# Patient Record
Sex: Male | Born: 1940 | Race: White | Hispanic: No | Smoking: Former smoker
Health system: Southern US, Community
[De-identification: ages and names within clinical notes are randomized; demographics above are authoritative.]

## PROBLEM LIST (undated history)

## (undated) DIAGNOSIS — I471 Supraventricular tachycardia, unspecified: Secondary | ICD-10-CM

## (undated) DIAGNOSIS — K279 Peptic ulcer, site unspecified, unspecified as acute or chronic, without hemorrhage or perforation: Secondary | ICD-10-CM

## (undated) DIAGNOSIS — E119 Type 2 diabetes mellitus without complications: Secondary | ICD-10-CM

## (undated) DIAGNOSIS — I10 Essential (primary) hypertension: Secondary | ICD-10-CM

## (undated) DIAGNOSIS — I209 Angina pectoris, unspecified: Secondary | ICD-10-CM

## (undated) DIAGNOSIS — I2510 Atherosclerotic heart disease of native coronary artery without angina pectoris: Secondary | ICD-10-CM

## (undated) DIAGNOSIS — Z972 Presence of dental prosthetic device (complete) (partial): Secondary | ICD-10-CM

## (undated) DIAGNOSIS — E785 Hyperlipidemia, unspecified: Secondary | ICD-10-CM

## (undated) DIAGNOSIS — N200 Calculus of kidney: Secondary | ICD-10-CM

## (undated) DIAGNOSIS — F329 Major depressive disorder, single episode, unspecified: Secondary | ICD-10-CM

## (undated) DIAGNOSIS — Z87442 Personal history of urinary calculi: Secondary | ICD-10-CM

## (undated) DIAGNOSIS — I219 Acute myocardial infarction, unspecified: Secondary | ICD-10-CM

## (undated) DIAGNOSIS — F32A Depression, unspecified: Secondary | ICD-10-CM

## (undated) DIAGNOSIS — E669 Obesity, unspecified: Secondary | ICD-10-CM

## (undated) DIAGNOSIS — L409 Psoriasis, unspecified: Secondary | ICD-10-CM

## (undated) HISTORY — DX: Supraventricular tachycardia: I47.1

## (undated) HISTORY — DX: Hyperlipidemia, unspecified: E78.5

## (undated) HISTORY — DX: Atherosclerotic heart disease of native coronary artery without angina pectoris: I25.10

## (undated) HISTORY — DX: Supraventricular tachycardia, unspecified: I47.10

---

## 1996-02-21 DIAGNOSIS — I2510 Atherosclerotic heart disease of native coronary artery without angina pectoris: Secondary | ICD-10-CM

## 1996-02-21 HISTORY — DX: Atherosclerotic heart disease of native coronary artery without angina pectoris: I25.10

## 1996-02-21 HISTORY — PX: CORONARY ARTERY BYPASS GRAFT: SHX141

## 2004-01-25 ENCOUNTER — Ambulatory Visit: Payer: Self-pay | Admitting: Urology

## 2004-04-28 ENCOUNTER — Ambulatory Visit: Payer: Self-pay | Admitting: Gastroenterology

## 2004-05-06 ENCOUNTER — Ambulatory Visit: Payer: Self-pay | Admitting: Gastroenterology

## 2004-06-30 ENCOUNTER — Ambulatory Visit: Payer: Self-pay | Admitting: Gastroenterology

## 2004-10-27 ENCOUNTER — Ambulatory Visit: Payer: Self-pay | Admitting: Urology

## 2005-01-17 ENCOUNTER — Inpatient Hospital Stay: Payer: Self-pay | Admitting: Unknown Physician Specialty

## 2005-05-31 ENCOUNTER — Ambulatory Visit: Payer: Self-pay | Admitting: Internal Medicine

## 2005-12-07 ENCOUNTER — Emergency Department: Payer: Self-pay | Admitting: Emergency Medicine

## 2005-12-18 ENCOUNTER — Emergency Department: Payer: Self-pay | Admitting: Emergency Medicine

## 2012-04-25 ENCOUNTER — Inpatient Hospital Stay: Payer: Self-pay

## 2012-04-25 LAB — COMPREHENSIVE METABOLIC PANEL
Albumin: 3.7 g/dL (ref 3.4–5.0)
Anion Gap: 6 — ABNORMAL LOW (ref 7–16)
BUN: 21 mg/dL — ABNORMAL HIGH (ref 7–18)
Calcium, Total: 9.3 mg/dL (ref 8.5–10.1)
Chloride: 103 mmol/L (ref 98–107)
Creatinine: 1.37 mg/dL — ABNORMAL HIGH (ref 0.60–1.30)
EGFR (African American): 59 — ABNORMAL LOW
EGFR (Non-African Amer.): 51 — ABNORMAL LOW
Glucose: 226 mg/dL — ABNORMAL HIGH (ref 65–99)
Osmolality: 278 (ref 275–301)
SGOT(AST): 17 U/L (ref 15–37)
SGPT (ALT): 16 U/L (ref 12–78)
Sodium: 134 mmol/L — ABNORMAL LOW (ref 136–145)

## 2012-04-25 LAB — URINALYSIS, COMPLETE
Bacteria: NONE SEEN
Bilirubin,UR: NEGATIVE
Glucose,UR: NEGATIVE mg/dL (ref 0–75)
Ketone: NEGATIVE
Leukocyte Esterase: NEGATIVE
Nitrite: NEGATIVE
Protein: 30
RBC,UR: 2 /HPF (ref 0–5)
Specific Gravity: 1.021 (ref 1.003–1.030)
Squamous Epithelial: NONE SEEN
WBC UR: <1 /HPF (ref 0–5)

## 2012-04-25 LAB — RAPID INFLUENZA A&B ANTIGENS

## 2012-04-25 LAB — LIPID PANEL
Cholesterol: 158 mg/dL (ref 0–200)
HDL Cholesterol: 77 mg/dL — ABNORMAL HIGH (ref 40–60)
Ldl Cholesterol, Calc: 62 mg/dL (ref 0–100)
Triglycerides: 97 mg/dL (ref 0–200)

## 2012-04-25 LAB — CBC
HCT: 38 % — ABNORMAL LOW (ref 40.0–52.0)
MCV: 98 fL (ref 80–100)
RDW: 12.4 % (ref 11.5–14.5)
WBC: 10.6 10*3/uL (ref 3.8–10.6)

## 2012-04-25 LAB — TROPONIN I: Troponin-I: <0.02 ng/mL

## 2012-04-26 LAB — CBC WITH DIFFERENTIAL/PLATELET
Basophil #: 0 10*3/uL (ref 0.0–0.1)
Basophil %: 0.3 %
Eosinophil #: 0.1 10*3/uL (ref 0.0–0.7)
Eosinophil %: 0.4 %
HCT: 35.4 % — ABNORMAL LOW (ref 40.0–52.0)
HGB: 12 g/dL — ABNORMAL LOW (ref 13.0–18.0)
Lymphocyte #: 1.4 10*3/uL (ref 1.0–3.6)
Lymphocyte %: 11.8 %
MCH: 33.6 pg (ref 26.0–34.0)
MCHC: 33.9 g/dL (ref 32.0–36.0)
MCV: 99 fL (ref 80–100)
Monocyte #: 1.2 x10 3/mm — ABNORMAL HIGH (ref 0.2–1.0)
Neutrophil #: 9.3 10*3/uL — ABNORMAL HIGH (ref 1.4–6.5)
Neutrophil %: 77.5 %
Platelet: 153 10*3/uL (ref 150–440)
RBC: 3.57 10*6/uL — ABNORMAL LOW (ref 4.40–5.90)
RDW: 12.3 % (ref 11.5–14.5)

## 2012-04-26 LAB — BASIC METABOLIC PANEL
Anion Gap: 11 (ref 7–16)
Calcium, Total: 8.9 mg/dL (ref 8.5–10.1)
Chloride: 103 mmol/L (ref 98–107)
Co2: 21 mmol/L (ref 21–32)
Creatinine: 1.24 mg/dL (ref 0.60–1.30)

## 2012-04-27 LAB — CBC WITH DIFFERENTIAL/PLATELET
Basophil %: 0.5 %
Eosinophil #: 0.3 10*3/uL (ref 0.0–0.7)
Eosinophil %: 4.2 %
HCT: 35.3 % — ABNORMAL LOW (ref 40.0–52.0)
Lymphocyte #: 1.5 10*3/uL (ref 1.0–3.6)
Lymphocyte %: 18.2 %
MCHC: 34.8 g/dL (ref 32.0–36.0)
MCV: 98 fL (ref 80–100)
Monocyte #: 0.6 x10 3/mm (ref 0.2–1.0)
Platelet: 170 10*3/uL (ref 150–440)
RBC: 3.59 10*6/uL — ABNORMAL LOW (ref 4.40–5.90)
RDW: 12.1 % (ref 11.5–14.5)

## 2012-04-27 LAB — COMPREHENSIVE METABOLIC PANEL
Albumin: 3.1 g/dL — ABNORMAL LOW (ref 3.4–5.0)
Alkaline Phosphatase: 71 U/L (ref 50–136)
Anion Gap: 7 (ref 7–16)
Bilirubin,Total: 0.8 mg/dL (ref 0.2–1.0)
Calcium, Total: 9.2 mg/dL (ref 8.5–10.1)
EGFR (African American): 55 — ABNORMAL LOW
EGFR (Non-African Amer.): 48 — ABNORMAL LOW
Glucose: 131 mg/dL — ABNORMAL HIGH (ref 65–99)
Osmolality: 281 (ref 275–301)
Potassium: 4.2 mmol/L (ref 3.5–5.1)
SGOT(AST): 18 U/L (ref 15–37)
Sodium: 138 mmol/L (ref 136–145)
Total Protein: 7.6 g/dL (ref 6.4–8.2)

## 2012-04-28 LAB — URINE CULTURE

## 2012-04-28 LAB — LIPASE, BLOOD: Lipase: 165 U/L (ref 73–393)

## 2013-09-10 DIAGNOSIS — N289 Disorder of kidney and ureter, unspecified: Secondary | ICD-10-CM | POA: Insufficient documentation

## 2013-09-10 DIAGNOSIS — L409 Psoriasis, unspecified: Secondary | ICD-10-CM | POA: Insufficient documentation

## 2013-09-10 DIAGNOSIS — E669 Obesity, unspecified: Secondary | ICD-10-CM | POA: Insufficient documentation

## 2013-09-10 DIAGNOSIS — E785 Hyperlipidemia, unspecified: Secondary | ICD-10-CM | POA: Insufficient documentation

## 2014-03-17 DIAGNOSIS — N200 Calculus of kidney: Secondary | ICD-10-CM | POA: Insufficient documentation

## 2014-06-10 DIAGNOSIS — D649 Anemia, unspecified: Secondary | ICD-10-CM | POA: Insufficient documentation

## 2014-06-12 NOTE — Consult Note (Signed)
Pt seen and examined. Full consult to follow. Acute pancreatits. 1st episode. Feels better. Lipase back to normal. U/S and CT report reviewed. Agree with MRCP.If neg for any mass, ok to start clear liquid diet and advance as tolerated. If possible mass, then order CA 19-9 level and then EUS as outpt. Will follow. Thanks.  Electronic Signatures: Verdie Shire (MD)  (Signed on 07-Mar-14 11:06)  Authored  Last Updated: 07-Mar-14 11:06 by Verdie Shire (MD)

## 2014-06-12 NOTE — Discharge Summary (Signed)
PATIENT NAME:  Johnny [name], Johnny [name] MR#:  [id number] DATE OF BIRTH:  1941-02-15  DATE OF ADMISSION:  04/25/2012 DATE OF DISCHARGE: 04/28/2012  CONSULTANTS: Verdie Shire, MD, gastroenterology.   DISCHARGE DIAGNOSIS: Acute pancreatitis.   DISCHARGE MEDICATIONS:  1.  Glipizide 5 mg 2 tabs in the morning and 1 tab at supper.  2.  Metformin 1000 mg b.i.d.  3.  Zoloft 100 mg daily.  4.  Simvastatin 80 mg at bedtime.  5.  Aspirin 325 mg daily.  6.  Isosorbide dinitrate 20 mg t.i.d.  7.  Losartan 100 mg daily.  8.  Prilosec 20 mg once daily.  9.  Allopurinol 100 mg once daily.   HISTORY AND PHYSICAL: A 74 year old male with a history of diabetes, hypertension, and coronary disease, status post CABG presented to the ED with complaints of abdominal pain. The pain had been ongoing for 4 days. The pain was accompanied by nausea, vomiting, fevers and chills. CT of the abdomen in the ER demonstrated pancreatic inflammation. Labs demonstrated an elevated lipase level of 1228. Liver tests were normal. WBC was mildly elevated at 10.6. Ultrasound of the upper quadrant was fairly unremarkable, no gallstones were demonstrated.   HOSPITAL COURSE: The patient was admitted. He was kept n.p.o. He was initiated on IV fluids. He was given pain control. Gastroenterology was consulted. He was evaluated by Dr. Candace Cruise. M.R.C.P. was also obtained, which showed no focal pancreatic mass. Again, no gallstones were seen. No cysts or pseudocysts were seen in the pancreas. On his third hospital day, he was initiated on clear liquid diet. He tolerated his clear liquids. His pain seemed to improve. His lipase level also improved. On his third hospital day, his diet was advanced to solid, low fat diet, which he tolerated well. Lipase level normalized and he was felt ready for discharge.   DISCHARGE INSTRUCTIONS:  1.  Follow up with Dr. Gilford Rile in 1 to 2 weeks.  2.  Continue to eat a low-fat diet.  3.  Resume all home medications.     ____________________________ A. Lavone Orn, MD ams:aw D: 04/28/2012 09:50:00 ET T: 04/28/2012 11:51:53 ET JOB#: [id number]  cc: Jenny Reichmann B. Sarina Ser, MD A. Lavone Orn, MD, <Dictator>   Sherlon Handing MD ELECTRONICALLY SIGNED 05/07/2012 13:11

## 2014-06-12 NOTE — H&P (Signed)
PATIENT NAME:  Johnny, [name] MR#:  [id number] DATE OF BIRTH:  27-Nov-1940  DATE OF ADMISSION:  04/25/2012  ADMITTING PHYSICIAN: Johnny Lighter, MD  PRIMARY CARE PHYSICIAN: Johnny B. Gilford Rile III, MD   CHIEF COMPLAINT: Abdominal pain.   HISTORY OF PRESENT ILLNESS: The patient is a 74 year old pleasant Caucasian male with past medical history significant for diabetes, hypertension and coronary artery disease, status post bypass graft history, comes to the hospital from home secondary to worsening abdominal pain, nausea and vomiting for 4 days now. The patient said it all started with chills and fever and headache 4 days ago associated with periumbilical abdominal pain with several episodes of vomiting 4 days ago. He initially thought that the pain and symptoms would get better, so he waited for a couple of days, as the symptoms started to get worse,  presented to the ER. In the ER, his main complaint was abdominal pain and vomiting. So, he had a CT of his abdomen done which showed pancreatic inflammation and his lipase was elevated, so he is being admitted for acute pancreatitis.   PAST MEDICAL HISTORY: 1.  Type II diabetes mellitus, non-insulin-dependent.  2.  History of nephrolithiasis.   3.  Hyperlipidemia.  4.  Coronary artery disease, status post bypass graft surgery in 1998.  5.  Peptic ulcer disease.  6.  Chronic anemia.   PAST SURGICAL HISTORY: 1.  Cervical laminectomy.  2.  Lumbar laminectomy.  3.  Coronary artery bypass graft surgery in 1998.   ALLERGIES TO MEDICATIONS: ACE INHIBITORS.   CURRENT HOME MEDICATIONS:  1.  Allopurinol 100 mg p.o. daily.  2.  Aspirin 325 mg p.o. daily.  3.  Isosorbide dinitrate 20 mg p.o. 3 times a day.  4.  Losartan 100 mg p.o. daily.  5.  Metformin 1000 mg p.o. b.i.d.  6.  Prilosec 20 mg p.o. daily.  7.  Simvastatin 80 mg p.o. daily.  8.  Zoloft 100 mg at bedtime.  9.  Glipizide 10 mg in the morning and 5 mg at bedtime.   SOCIAL HISTORY:  Lives at home with his wife. No history of any smoking or alcohol use.   FAMILY HISTORY:  Heart disease in both parents,  no history of diabetes in the family.    REVIEW OF SYSTEMS:   CONSTITUTIONAL: Positive for fever, fatigue and weakness.  EYES: No blurred vision, double vision, glaucoma or cataracts. Uses reading glasses.  ENT: No tinnitus, ear pain, hearing loss, epistaxis or discharge.  RESPIRATORY: Positive for dry cough. No wheeze, hemoptysis or COPD. CARDIOVASCULAR: No chest pain, orthopnea, edema, arrhythmia, palpitations or syncope.  GASTROINTESTINAL: Positive for nausea, vomiting and abdominal pain. No hematemesis, melena or diarrhea.  GENITOURINARY: No dysuria, hematuria, renal calculus, frequency or incontinence.  ENDOCRINE: No polyuria, nocturia, thyroid problems, heat or cold intolerance.  HEMATOLOGY: No anemia, easy bruising or bleeding.  SKIN: No acne, rash or lesions.  MUSCULOSKELETAL: No neck, back, shoulder pain, arthritis or gout.  NEUROLOGIC: No numbness, weakness, CVA, TIA or seizures.  PSYCHOLOGICAL: No anxiety, insomnia or depression.   PHYSICAL EXAMINATION: VITAL SIGNS: Temperature 98.7 degrees Fahrenheit, pulse 78, respirations 18, blood pressure 116/73, pulse oximetry 97% on room air.  GENERAL: Well-built, well-nourished male lying in bed, not in any acute distress.  HEENT: Normocephalic, atraumatic. Pupils round, reacting to light. Anicteric sclerae. Extraocular movements intact. Oropharynx clear without erythema, mass or exudates.  NECK: Supple. No thyromegaly, JVD or carotid bruits. No lymphadenopathy.  LUNGS: Moving air bilaterally. No wheeze or  crackles. No use of accessory muscles for breathing.   CARDIOVASCULAR: S1, S2 regular rate and rhythm, 3/6 systolic murmur in mitral and aortic areas.  No rubs or gallops.  ABDOMEN: Soft and tender with voluntary guarding in the right upper quadrant and periumbilical regions, radiating to the back. No rigidity or  rebound tenderness.  Hypoactive bowel sounds present.    EXTREMITIES: No pedal edema. No clubbing or cyanosis, 2+ dorsalis pedis pulses palpable bilaterally.  SKIN: No acne, rash or lesions.  LYMPHATICS: No cervical lymphadenopathy.  NEUROLOGIC: Cranial nerves intact. No focal motor or sensory deficits.  PSYCHOLOGICAL: The patient is awake, alert, oriented x 3.   LABORATORY, DIAGNOSTIC AND RADIOLOGIC DATA: WBC 10.6, hemoglobin 13.1, hematocrit 38.0, platelet count 182.   Sodium 134, potassium 4.2, chloride 103, bicarbonate 25, BUN 21, creatinine 1.37 and glucose of 226, calcium 9.3. ALT 16, AST 17, alkaline phosphatase 79, total bilirubin is 0.9 and albumin 3.7. Cardiac enzymes first set negative. Urinalysis negative for any infection and lipase elevated at 1228.   Ultrasound of the abdomen showing unremarkable right upper quadrant ultrasound. No dilatation of intra or extrahepatic bile ducts and no evidence of cholelithiasis.    CT of the abdomen and pelvis with contrast showing low attenuation in the pancreatic head with surrounding hazy inflammatory changes concerning for pancreatitis. Underlying lesion is not excluded.  Follow up of the CT of the abdomen is recommended and correlate with lipase levels.    EKG showing normal sinus rhythm, heart rate of 63. No acute ST-T wave abnormalities.   ASSESSMENT AND PLAN: A 74 year old male with history of diabetes, hypertension and coronary artery disease admitted for abdominal pain and CT of the abdomen showing pancreatitis with elevated lipase.  1.  Acute pancreatitis, likely gallstone pancreatitis. No gallstones seen on CT, but his presentation is typical of that. However, he is on some medications, which he has been taking for a long time, could also worsen his pancreatitis. We will keep him nothing orally, hold all oral medications, IV fluids and follow-up lipase in the morning. Also, his CT did suggest a possible underlying pancreatic mass, which  cannot be excluded, so we will get an MRCP. 2.  Hypertension. Hold oral medications, as the patient is nothing orally. We will place on IV hydralazine as needed.   3.  Diabetes mellitus.  Again follow up hba1c and hold his oral medication and on sliding scale insulin as he is not eating orally.    3. Coronary artery disease, status post bypass graft surgery, appears stable for now. Continue to monitor and hold oral medications until stable from pancreatitis standpoint.  4.  Acute renal failure from dehydration, prerenal causes and he is also on losartan and metformin at home, which we are holding at this time, gentle IV hydration and follow up labs in the morning.   5.  Gastrointestinal and deep vein thrombosis prophylaxis. On Protonix IV and subcutaneous heparin.   CODE STATUS: Full code.   TIME SPENT ON ADMISSION: 50 minutes.      ____________________________ Johnny Lighter, MD rk:cc D: 04/25/2012 16:04:49 ET T: 04/25/2012 17:11:23 ET JOB#: [id number]  cc: Johnny Lighter, MD, <Dictator> Johnny B. Sarina Ser, MD Johnny Lighter MD ELECTRONICALLY SIGNED 05/04/2012 13:18

## 2014-06-12 NOTE — Consult Note (Signed)
Chief Complaint:  Subjective/Chief Complaint Feeling hungry. Minimal abd pain. MRI showed no pancreatic mass. Started clears this AM.   VITAL SIGNS/ANCILLARY NOTES: **Vital Signs.:   08-Mar-14 05:34  Vital Signs Type Routine  Temperature Temperature (F) 98.3  Celsius 36.8  Temperature Source oral  Pulse Pulse 58  Respirations Respirations 18  Systolic BP Systolic BP 563  Diastolic BP (mmHg) Diastolic BP (mmHg) 60  Mean BP 74  Pulse Ox % Pulse Ox % 93  Pulse Ox Activity Level  At rest  Oxygen Delivery Room Air/ 21 %   Brief Assessment:  Cardiac Regular   Respiratory clear BS   Gastrointestinal Normal   Lab Results: Hepatic:  08-Mar-14 04:47   Bilirubin, Total 0.8  Alkaline Phosphatase 71  SGPT (ALT) 13  SGOT (AST) 18  Total Protein, Serum 7.6  Albumin, Serum  3.1  Routine Chem:  08-Mar-14 04:47   Glucose, Serum  131  BUN  22  Creatinine (comp)  1.44  Sodium, Serum 138  Potassium, Serum 4.2  Chloride, Serum 107  CO2, Serum 24  Calcium (Total), Serum 9.2  Osmolality (calc) 281  eGFR (African American)  55  eGFR (Non-African American)  48 (eGFR values <45m/min/1.73 m2 may be an indication of chronic kidney disease (CKD). Calculated eGFR is useful in patients with stable renal function. The eGFR calculation will not be reliable in acutely ill patients when serum creatinine is changing rapidly. It is not useful in  patients on dialysis. The eGFR calculation may not be applicable to patients at the low and high extremes of body sizes, pregnant women, and vegetarians.)  Anion Gap 7  Routine Hem:  08-Mar-14 04:47   WBC (CBC) 8.0  RBC (CBC)  3.59  Hemoglobin (CBC)  12.3  Hematocrit (CBC)  35.3  Platelet Count (CBC) 170  MCV 98  MCH  34.2  MCHC 34.8  RDW 12.1  Neutrophil % 69.7  Lymphocyte % 18.2  Monocyte % 7.4  Eosinophil % 4.2  Basophil % 0.5  Neutrophil # 5.6  Lymphocyte # 1.5  Monocyte # 0.6  Eosinophil # 0.3  Basophil # 0.0 (Result(s) reported  on 27 Apr 2012 at 05:37AM.)   Assessment/Plan:  Assessment/Plan:  Assessment Acute pancreatitis. Resolving.   Plan Recheck lipase in AM. If stable, low fat diet. If tolerates low fat diet, then stable for discharge tomorrow. Thanks   Electronic Signatures: OVerdie Shire(MD)  (Signed 08-Mar-14 10:31)  Authored: Chief Complaint, VITAL SIGNS/ANCILLARY NOTES, Brief Assessment, Lab Results, Assessment/Plan   Last Updated: 08-Mar-14 10:31 by OVerdie Shire(MD)

## 2014-06-12 NOTE — Consult Note (Signed)
PATIENT NAME:  Johnny [name], Johnny [name] MR#:  [id number] DATE OF BIRTH:  23-Jun-1940  DATE OF ADMISSION:  04/25/2012  DATE OF CONSULTATION:  04/26/2012  CONSULTING PHYSICIAN: Verdie Shire, M.D.   REASON FOR REFERRAL:  Acute pancreatitis.   HISTORY OF PRESENT ILLNESS:  The patient is a 74 year old white male with a known history of coronary artery disease and diabetes, presented to the hospital on March 6 with at least a 4-day history of worsening abdominal pain, nausea and vomiting. It started with some periumbilical pain that radiated to his back associated with some fevers and chills and headaches. Because the symptoms were not getting better, he came in for further evaluation. He then had a CT of the abdomen that showed inflammation of the head of the pancreas. Also lipase was abnormal at 1228. As a result, the patient was brought in for further evaluation and management.   The patient feeling much better today, much of the pain has improved significantly, although he still has no appetite. His lipase is back to normal at 316.  The patient denies any prior history of gallbladder disease. He does not drink any alcohol or smoke. There is no family history of pancreatic disorders or gallbladder disorders that he is aware of.   PAST MEDICAL HISTORY:  Notable for diabetes and kidney stones. He also has had coronary artery disease that required bypass surgery in 1998.   Other history includes history of hyperlipidemia and ulcer disease.   PAST SURGICAL HISTORY:  Includes cervical lumbar laminectomy and bypass surgery in 1998.   ALLERGIES:  ACE INHIBITORS.   MEDICATIONS AT HOME:  Include regular aspirin daily, allopurinol daily, isosorbide, losartan, metformin, Prilosec, glipizide, Zoloft and Zocor.  Again, he denies any tobacco and alcohol history.  Family history was described already.  There is heart disease in both parents.   REVIEW OF SYMPTOMS:  Please refer to the initial H and P dictated yesterday on  March 6. There are no significant changes in review of symptoms.   PHYSICAL EXAMINATION: GENERAL:  The patient looks stable. He is afebrile. VITAL SIGNS:  Stable.  CARDIAC:  Reveal regular rhythm and rate without murmurs.  LUNGS:  Clear bilaterally.  ABDOMEN:  Shows normoactive bowel sounds, soft. There is only minimal tenderness in the epigastric region. There is no hepatomegaly.  EXTREMITIES:  Show no clubbing, cyanosis or edema.   LABORATORY DATA:  Again, the liver enzymes are normal. Lipase is back to normal at 316. Sodium 135, potassium 4.2, chloride 103, CO2 21, BUN 17, creatinine 1.24, glucose 158, white count 12.0, hemoglobin 12.0, platelet count 153. Urinalysis is negative.   In terms of the CT, there is some low attenuation in the pancreatic head with some surrounding inflammatory changes concerning for pancreatitis.  The patient with acute pancreatitis. It appears to be his first bout. At least the gallbladder looks normal and the liver enzymes look normal. I agree with getting an MRI of the pancreas to at least rule out pancreatic mass. If he shows a mass, then we should order a CA19-9 level as well as endoscopic ultrasound for confirmation and staging. If the MRCP is negative, then we can start the clear liquid diet and advance as tolerated.  Thank you for the referral.     ____________________________ Lupita Dawn. Candace Cruise, MD pyo:ce D: 04/27/2012 08:30:03 ET T: 04/27/2012 11:55:57 ET JOB#: [id number]  cc: Lupita Dawn. Candace Cruise, MD, <Dictator> Lupita Dawn OH MD ELECTRONICALLY SIGNED 04/28/2012 8:11

## 2014-06-12 NOTE — Consult Note (Signed)
Chief Complaint:  Subjective/Chief Complaint Back pain last night. No abd pain. Tolerated clears. Lipase remains normal.   VITAL SIGNS/ANCILLARY NOTES: **Vital Signs.:   09-Mar-14 04:59  Vital Signs Type Routine  Temperature Temperature (F) 98.6  Celsius 37  Temperature Source oral  Pulse Pulse 55  Respirations Respirations 18  Systolic BP Systolic BP 643  Diastolic BP (mmHg) Diastolic BP (mmHg) 73  Mean BP 94  Pulse Ox % Pulse Ox % 99  Pulse Ox Activity Level  At rest  Oxygen Delivery Room Air/ 21 %   Brief Assessment:  Cardiac Regular   Respiratory clear BS   Gastrointestinal Normal   Lab Results: Routine Chem:  09-Mar-14 04:29   Lipase 165 (Result(s) reported on 28 Apr 2012 at 05:36AM.)   Assessment/Plan:  Assessment/Plan:  Assessment Pancreatitis. Resolved.   Plan Low fat diet. If tolerated, ok for discharge. Will sign off. thanks.   Electronic Signatures: Verdie Shire (MD)  (Signed 09-Mar-14 09:21)  Authored: Chief Complaint, VITAL SIGNS/ANCILLARY NOTES, Brief Assessment, Lab Results, Assessment/Plan   Last Updated: 09-Mar-14 09:21 by Verdie Shire (MD)

## 2015-03-11 DIAGNOSIS — E1121 Type 2 diabetes mellitus with diabetic nephropathy: Secondary | ICD-10-CM | POA: Insufficient documentation

## 2015-03-11 DIAGNOSIS — E118 Type 2 diabetes mellitus with unspecified complications: Secondary | ICD-10-CM | POA: Insufficient documentation

## 2015-10-18 ENCOUNTER — Encounter: Admission: RE | Payer: Self-pay | Source: Ambulatory Visit

## 2015-10-18 ENCOUNTER — Ambulatory Visit: Admission: RE | Admit: 2015-10-18 | Payer: Medicare Other | Source: Ambulatory Visit | Admitting: Gastroenterology

## 2015-10-18 SURGERY — ESOPHAGOGASTRODUODENOSCOPY (EGD) WITH PROPOFOL
Anesthesia: General

## 2015-11-22 DIAGNOSIS — I2510 Atherosclerotic heart disease of native coronary artery without angina pectoris: Secondary | ICD-10-CM | POA: Insufficient documentation

## 2015-11-23 DIAGNOSIS — I200 Unstable angina: Secondary | ICD-10-CM | POA: Diagnosis present

## 2015-12-07 ENCOUNTER — Ambulatory Visit
Admission: RE | Admit: 2015-12-07 | Discharge: 2015-12-07 | Disposition: A | Discharge status: Home / Self Care | Payer: Medicare Other | Source: Ambulatory Visit | Attending: Internal Medicine | Admitting: Internal Medicine

## 2015-12-07 ENCOUNTER — Encounter
Admission: RE | Disposition: A | Discharge status: Home / Self Care | Payer: Self-pay | Source: Ambulatory Visit | Attending: Internal Medicine

## 2015-12-07 ENCOUNTER — Encounter: Payer: Self-pay | Admitting: *Deleted

## 2015-12-07 DIAGNOSIS — E669 Obesity, unspecified: Secondary | ICD-10-CM | POA: Diagnosis not present

## 2015-12-07 DIAGNOSIS — Z79899 Other long term (current) drug therapy: Secondary | ICD-10-CM | POA: Diagnosis not present

## 2015-12-07 DIAGNOSIS — Z87891 Personal history of nicotine dependence: Secondary | ICD-10-CM | POA: Insufficient documentation

## 2015-12-07 DIAGNOSIS — E7800 Pure hypercholesterolemia, unspecified: Secondary | ICD-10-CM | POA: Diagnosis not present

## 2015-12-07 DIAGNOSIS — I200 Unstable angina: Secondary | ICD-10-CM | POA: Diagnosis present

## 2015-12-07 DIAGNOSIS — E119 Type 2 diabetes mellitus without complications: Secondary | ICD-10-CM | POA: Insufficient documentation

## 2015-12-07 DIAGNOSIS — I25710 Atherosclerosis of autologous vein coronary artery bypass graft(s) with unstable angina pectoris: Secondary | ICD-10-CM | POA: Insufficient documentation

## 2015-12-07 DIAGNOSIS — Z951 Presence of aortocoronary bypass graft: Secondary | ICD-10-CM | POA: Diagnosis not present

## 2015-12-07 DIAGNOSIS — Z7984 Long term (current) use of oral hypoglycemic drugs: Secondary | ICD-10-CM | POA: Insufficient documentation

## 2015-12-07 DIAGNOSIS — Z6827 Body mass index (BMI) 27.0-27.9, adult: Secondary | ICD-10-CM | POA: Insufficient documentation

## 2015-12-07 DIAGNOSIS — I2582 Chronic total occlusion of coronary artery: Secondary | ICD-10-CM | POA: Diagnosis not present

## 2015-12-07 DIAGNOSIS — I10 Essential (primary) hypertension: Secondary | ICD-10-CM | POA: Diagnosis not present

## 2015-12-07 DIAGNOSIS — I25110 Atherosclerotic heart disease of native coronary artery with unstable angina pectoris: Secondary | ICD-10-CM | POA: Insufficient documentation

## 2015-12-07 DIAGNOSIS — I209 Angina pectoris, unspecified: Secondary | ICD-10-CM | POA: Diagnosis present

## 2015-12-07 HISTORY — DX: Angina pectoris, unspecified: I20.9

## 2015-12-07 HISTORY — DX: Type 2 diabetes mellitus without complications: E11.9

## 2015-12-07 HISTORY — DX: Essential (primary) hypertension: I10

## 2015-12-07 HISTORY — DX: Depression, unspecified: F32.A

## 2015-12-07 HISTORY — DX: Major depressive disorder, single episode, unspecified: F32.9

## 2015-12-07 HISTORY — PX: CARDIAC CATHETERIZATION: SHX172

## 2015-12-07 HISTORY — DX: Acute myocardial infarction, unspecified: I21.9

## 2015-12-07 SURGERY — LEFT HEART CATH AND CORS/GRAFTS ANGIOGRAPHY
Anesthesia: Moderate Sedation | Laterality: Left

## 2015-12-07 MED ORDER — SODIUM CHLORIDE 0.9% FLUSH: 3.0000 mL | INTRAVENOUS | Status: DC | PRN

## 2015-12-07 MED ORDER — HEPARIN (PORCINE) IN NACL 2-0.9 UNIT/ML-% IJ SOLN
INTRAMUSCULAR | Status: AC
  Filled 2015-12-07: qty 500

## 2015-12-07 MED ORDER — FENTANYL CITRATE (PF) 100 MCG/2ML IJ SOLN
INTRAMUSCULAR | Status: DC | PRN
  Administered 2015-12-07: 25 ug via INTRAVENOUS

## 2015-12-07 MED ORDER — IOPAMIDOL (ISOVUE-300) INJECTION 61%
INTRAVENOUS | Status: DC | PRN
  Administered 2015-12-07: 210 mL via INTRA_ARTERIAL

## 2015-12-07 MED ORDER — FENTANYL CITRATE (PF) 100 MCG/2ML IJ SOLN
INTRAMUSCULAR | Status: AC
  Filled 2015-12-07: qty 2

## 2015-12-07 MED ORDER — ASPIRIN 81 MG PO CHEW: 81.0000 mg | CHEWABLE_TABLET | ORAL | Status: DC

## 2015-12-07 MED ORDER — ACETAMINOPHEN 325 MG PO TABS: 650.0000 mg | ORAL_TABLET | ORAL | Status: DC | PRN

## 2015-12-07 MED ORDER — MIDAZOLAM HCL 2 MG/2ML IJ SOLN
INTRAMUSCULAR | Status: AC
  Filled 2015-12-07: qty 2

## 2015-12-07 MED ORDER — SODIUM CHLORIDE 0.9% FLUSH: 3.0000 mL | Freq: Two times a day (BID) | INTRAVENOUS | Status: DC

## 2015-12-07 MED ORDER — SODIUM CHLORIDE 0.9 % WEIGHT BASED INFUSION
3.0000 mL/kg/h | INTRAVENOUS | Status: DC
  Administered 2015-12-07: 3 mL/kg/h via INTRAVENOUS

## 2015-12-07 MED ORDER — SODIUM CHLORIDE 0.9 % WEIGHT BASED INFUSION: 3.0000 mL/kg/h | INTRAVENOUS | Status: DC

## 2015-12-07 MED ORDER — MIDAZOLAM HCL 2 MG/2ML IJ SOLN
INTRAMUSCULAR | Status: DC | PRN
  Administered 2015-12-07: 1 mg via INTRAVENOUS

## 2015-12-07 MED ORDER — SODIUM CHLORIDE 0.9 % IV SOLN: 250.0000 mL | INTRAVENOUS | Status: DC | PRN

## 2015-12-07 MED ORDER — ONDANSETRON HCL 4 MG/2ML IJ SOLN: 4.0000 mg | Freq: Four times a day (QID) | INTRAMUSCULAR | Status: DC | PRN

## 2015-12-07 MED ORDER — SODIUM CHLORIDE 0.9 % WEIGHT BASED INFUSION: 1.0000 mL/kg/h | INTRAVENOUS | Status: DC

## 2015-12-07 SURGICAL SUPPLY — 13 items
CATH 5FR IM DIAGNOSTIC (CATHETERS) ×1 IMPLANT
CATH 5FR JL4 DIAGNOSTIC (CATHETERS) ×2 IMPLANT
CATH 5FR PIGTAIL DIAGNOSTIC (CATHETERS) ×1 IMPLANT
CATH INFINITI 5 FR MPA2 (CATHETERS) ×2 IMPLANT
CATH INFINITI 5 FR RCB (CATHETERS) ×2 IMPLANT
CATH INFINITI JR4 5F (CATHETERS) ×2 IMPLANT
DEVICE CLOSURE MYNXGRIP 5F (Vascular Products) ×2 IMPLANT
KIT MANI 3VAL PERCEP (MISCELLANEOUS) ×3 IMPLANT
NDL PERC 18GX7CM (NEEDLE) IMPLANT
NEEDLE PERC 18GX7CM (NEEDLE) ×3 IMPLANT
PACK CARDIAC CATH (CUSTOM PROCEDURE TRAY) ×3 IMPLANT
SHEATH PINNACLE 5F 10CM (SHEATH) ×2 IMPLANT
WIRE EMERALD 3MM-J .035X150CM (WIRE) ×2 IMPLANT

## 2015-12-07 NOTE — Discharge Instructions (Signed)
°  For pain at the site of your procedure, take non-aspirin medicines such as Tylenol.  Medications: A. Hold Metformin for 48 hours if applicable.  B. Continue taking all your present medications at home unless your doctor prescribes any changes.

## 2015-12-21 DIAGNOSIS — I10 Essential (primary) hypertension: Secondary | ICD-10-CM | POA: Insufficient documentation

## 2015-12-21 DIAGNOSIS — I2119 ST elevation (STEMI) myocardial infarction involving other coronary artery of inferior wall: Secondary | ICD-10-CM | POA: Insufficient documentation

## 2017-01-18 DIAGNOSIS — I951 Orthostatic hypotension: Secondary | ICD-10-CM | POA: Insufficient documentation

## 2017-08-09 DIAGNOSIS — R0602 Shortness of breath: Secondary | ICD-10-CM | POA: Insufficient documentation

## 2017-08-09 DIAGNOSIS — R42 Dizziness and giddiness: Secondary | ICD-10-CM | POA: Insufficient documentation

## 2017-09-03 ENCOUNTER — Observation Stay
Admission: EM | Admit: 2017-09-03 | Discharge: 2017-09-04 | Disposition: A | Discharge status: Home / Self Care | Payer: Medicare Other | Attending: Specialist | Admitting: Specialist

## 2017-09-03 ENCOUNTER — Encounter: Payer: Self-pay | Admitting: Emergency Medicine

## 2017-09-03 ENCOUNTER — Emergency Department: Payer: Medicare Other

## 2017-09-03 ENCOUNTER — Observation Stay
Admit: 2017-09-03 | Discharge: 2017-09-03 | Disposition: A | Discharge status: Home / Self Care | Payer: Medicare Other | Attending: Specialist | Admitting: Specialist

## 2017-09-03 ENCOUNTER — Other Ambulatory Visit: Payer: Self-pay

## 2017-09-03 DIAGNOSIS — I2510 Atherosclerotic heart disease of native coronary artery without angina pectoris: Secondary | ICD-10-CM | POA: Insufficient documentation

## 2017-09-03 DIAGNOSIS — Z794 Long term (current) use of insulin: Secondary | ICD-10-CM | POA: Insufficient documentation

## 2017-09-03 DIAGNOSIS — G2581 Restless legs syndrome: Secondary | ICD-10-CM | POA: Insufficient documentation

## 2017-09-03 DIAGNOSIS — Z79899 Other long term (current) drug therapy: Secondary | ICD-10-CM | POA: Insufficient documentation

## 2017-09-03 DIAGNOSIS — I252 Old myocardial infarction: Secondary | ICD-10-CM | POA: Insufficient documentation

## 2017-09-03 DIAGNOSIS — R0789 Other chest pain: Secondary | ICD-10-CM | POA: Diagnosis present

## 2017-09-03 DIAGNOSIS — I10 Essential (primary) hypertension: Secondary | ICD-10-CM | POA: Insufficient documentation

## 2017-09-03 DIAGNOSIS — E782 Mixed hyperlipidemia: Secondary | ICD-10-CM | POA: Diagnosis not present

## 2017-09-03 DIAGNOSIS — I471 Supraventricular tachycardia: Principal | ICD-10-CM | POA: Insufficient documentation

## 2017-09-03 DIAGNOSIS — R001 Bradycardia, unspecified: Secondary | ICD-10-CM | POA: Diagnosis not present

## 2017-09-03 DIAGNOSIS — R079 Chest pain, unspecified: Secondary | ICD-10-CM | POA: Diagnosis present

## 2017-09-03 DIAGNOSIS — Z7984 Long term (current) use of oral hypoglycemic drugs: Secondary | ICD-10-CM | POA: Insufficient documentation

## 2017-09-03 DIAGNOSIS — F329 Major depressive disorder, single episode, unspecified: Secondary | ICD-10-CM | POA: Diagnosis not present

## 2017-09-03 DIAGNOSIS — Z951 Presence of aortocoronary bypass graft: Secondary | ICD-10-CM | POA: Insufficient documentation

## 2017-09-03 DIAGNOSIS — R52 Pain, unspecified: Secondary | ICD-10-CM

## 2017-09-03 DIAGNOSIS — Z888 Allergy status to other drugs, medicaments and biological substances status: Secondary | ICD-10-CM | POA: Insufficient documentation

## 2017-09-03 DIAGNOSIS — E119 Type 2 diabetes mellitus without complications: Secondary | ICD-10-CM | POA: Insufficient documentation

## 2017-09-03 DIAGNOSIS — Z7982 Long term (current) use of aspirin: Secondary | ICD-10-CM | POA: Diagnosis not present

## 2017-09-03 LAB — GLUCOSE, CAPILLARY
Glucose-Capillary: 223 mg/dL — ABNORMAL HIGH (ref 70–99)
Glucose-Capillary: 211 mg/dL — ABNORMAL HIGH (ref 70–99)
Glucose-Capillary: 223 mg/dL — ABNORMAL HIGH (ref 70–99)
Glucose-Capillary: 259 mg/dL — ABNORMAL HIGH (ref 70–99)

## 2017-09-03 LAB — BASIC METABOLIC PANEL
Anion gap: 8 (ref 5–15)
BUN: 17 mg/dL (ref 8–23)
CO2: 23 mmol/L (ref 22–32)
Calcium: 8.9 mg/dL (ref 8.9–10.3)
Chloride: 107 mmol/L (ref 98–111)
Creatinine, Ser: 1.16 mg/dL (ref 0.61–1.24)
GFR calc Af Amer: >60 mL/min (ref >60)
GFR, EST NON AFRICAN AMERICAN: 58 mL/min — AB (ref >60)
GLUCOSE: 249 mg/dL — AB (ref 70–99)
POTASSIUM: 4.1 mmol/L (ref 3.5–5.1)
SODIUM: 138 mmol/L (ref 135–145)

## 2017-09-03 LAB — CBC
HEMATOCRIT: 37.2 % — AB (ref 40.0–52.0)
Hemoglobin: 13.1 g/dL (ref 13.0–18.0)
MCH: 34 pg (ref 26.0–34.0)
MCHC: 35.1 g/dL (ref 32.0–36.0)
MCV: 96.7 fL (ref 80.0–100.0)
PLATELETS: 181 10*3/uL (ref 150–440)
RBC: 3.85 MIL/uL — ABNORMAL LOW (ref 4.40–5.90)
RDW: 12.6 % (ref 11.5–14.5)
WBC: 6 10*3/uL (ref 3.8–10.6)

## 2017-09-03 LAB — TROPONIN I
Troponin I: <0.03 ng/mL (ref <0.03)
Troponin I: 0.03 ng/mL (ref <0.03)
Troponin I: <0.03 ng/mL (ref <0.03)
Troponin I: <0.03 ng/mL (ref <0.03)

## 2017-09-03 LAB — TSH: TSH: 2.49 u[IU]/mL (ref 0.350–4.500)

## 2017-09-03 LAB — HEMOGLOBIN A1C
Hgb A1c MFr Bld: 9.2 % — ABNORMAL HIGH (ref 4.8–5.6)
Mean Plasma Glucose: 217.34 mg/dL

## 2017-09-03 MED ORDER — SIMVASTATIN 20 MG PO TABS
40.0000 mg | ORAL_TABLET | Freq: Every evening | ORAL | Status: DC
  Administered 2017-09-03: 40 mg via ORAL
  Filled 2017-09-03: qty 2

## 2017-09-03 MED ORDER — ONDANSETRON HCL 4 MG PO TABS: 4.0000 mg | ORAL_TABLET | Freq: Four times a day (QID) | ORAL | Status: DC | PRN

## 2017-09-03 MED ORDER — ONDANSETRON HCL 4 MG/2ML IJ SOLN: 4.0000 mg | Freq: Four times a day (QID) | INTRAMUSCULAR | Status: DC | PRN

## 2017-09-03 MED ORDER — ROPINIROLE HCL 0.25 MG PO TABS
0.2500 mg | ORAL_TABLET | Freq: Every day | ORAL | Status: DC
  Administered 2017-09-03: 0.25 mg via ORAL
  Filled 2017-09-03 (×2): qty 1

## 2017-09-03 MED ORDER — ALLOPURINOL 100 MG PO TABS
100.0000 mg | ORAL_TABLET | Freq: Every day | ORAL | Status: DC
  Administered 2017-09-03 – 2017-09-04 (×2): 100 mg via ORAL
  Filled 2017-09-03 (×2): qty 1

## 2017-09-03 MED ORDER — DIPHENHYDRAMINE HCL 25 MG PO CAPS: 25.0000 mg | ORAL_CAPSULE | Freq: Every evening | ORAL | Status: DC | PRN

## 2017-09-03 MED ORDER — ASPIRIN EC 81 MG PO TBEC
81.0000 mg | DELAYED_RELEASE_TABLET | Freq: Every day | ORAL | Status: DC
  Administered 2017-09-03 – 2017-09-04 (×2): 81 mg via ORAL
  Filled 2017-09-03 (×2): qty 1

## 2017-09-03 MED ORDER — DOCUSATE SODIUM 100 MG PO CAPS
100.0000 mg | ORAL_CAPSULE | Freq: Two times a day (BID) | ORAL | Status: DC
  Administered 2017-09-03 – 2017-09-04 (×3): 100 mg via ORAL
  Filled 2017-09-03 (×3): qty 1

## 2017-09-03 MED ORDER — PANTOPRAZOLE SODIUM 40 MG PO TBEC
40.0000 mg | DELAYED_RELEASE_TABLET | Freq: Every day | ORAL | Status: DC
  Administered 2017-09-03 – 2017-09-04 (×2): 40 mg via ORAL
  Filled 2017-09-03 (×2): qty 1

## 2017-09-03 MED ORDER — ENOXAPARIN SODIUM 40 MG/0.4ML ~~LOC~~ SOLN
40.0000 mg | SUBCUTANEOUS | Status: DC
  Administered 2017-09-03: 40 mg via SUBCUTANEOUS
  Filled 2017-09-03: qty 0.4

## 2017-09-03 MED ORDER — CYCLOBENZAPRINE HCL 10 MG PO TABS
5.0000 mg | ORAL_TABLET | Freq: Every day | ORAL | Status: DC
  Administered 2017-09-03: 5 mg via ORAL
  Filled 2017-09-03: qty 1

## 2017-09-03 MED ORDER — INSULIN ASPART 100 UNIT/ML ~~LOC~~ SOLN
0.0000 [IU] | Freq: Three times a day (TID) | SUBCUTANEOUS | Status: DC
  Administered 2017-09-03 – 2017-09-04 (×5): 3 [IU] via SUBCUTANEOUS
  Filled 2017-09-03 (×5): qty 1

## 2017-09-03 MED ORDER — ONDANSETRON HCL 4 MG/2ML IJ SOLN
INTRAMUSCULAR | Status: AC
  Filled 2017-09-03: qty 2

## 2017-09-03 MED ORDER — OXYCODONE-ACETAMINOPHEN 5-325 MG PO TABS: 1.0000 | ORAL_TABLET | ORAL | Status: DC | PRN

## 2017-09-03 MED ORDER — ASPIRIN 81 MG PO CHEW
324.0000 mg | CHEWABLE_TABLET | Freq: Once | ORAL | Status: AC
  Administered 2017-09-03: 324 mg via ORAL
  Filled 2017-09-03: qty 4

## 2017-09-03 MED ORDER — ACETAMINOPHEN 325 MG PO TABS: 650.0000 mg | ORAL_TABLET | Freq: Four times a day (QID) | ORAL | Status: DC | PRN

## 2017-09-03 MED ORDER — ONDANSETRON HCL 4 MG/2ML IJ SOLN
4.0000 mg | Freq: Once | INTRAMUSCULAR | Status: AC
  Administered 2017-09-03: 4 mg via INTRAVENOUS

## 2017-09-03 MED ORDER — INSULIN ASPART 100 UNIT/ML ~~LOC~~ SOLN
0.0000 [IU] | Freq: Every day | SUBCUTANEOUS | Status: DC
  Administered 2017-09-03: 3 [IU] via SUBCUTANEOUS
  Filled 2017-09-03: qty 1

## 2017-09-03 MED ORDER — ACETAMINOPHEN 650 MG RE SUPP: 650.0000 mg | Freq: Four times a day (QID) | RECTAL | Status: DC | PRN

## 2017-09-03 NOTE — Progress Notes (Signed)
*  PRELIMINARY RESULTS* Echocardiogram 2D Echocardiogram has been performed.  Sherrie Sport 09/03/2017, 2:35 PM

## 2017-09-03 NOTE — ED Notes (Signed)
Patient transported to 243 

## 2017-09-03 NOTE — Consult Note (Signed)
[facility] Consultation Note  Patient ID: Johnny [name], MRN: [id number], DOB/AGE: 1940-11-09 77 y.o. Admit date: 09/03/2017   Date of Consult: 09/03/2017 Primary Physician: Baxter Hire, MD Primary Cardiologist: Nehemiah Massed  Chief Complaint:  Chief Complaint  Patient presents with  . Chest Pain   Reason for Consult: Supraventricular tachycardia  HPI: 77 y.o. male with known cardiovascular disease status post coronary bypass graft in the remote past for which she has had appropriate medication management including hypertension hyperlipidemia medication management.  Patient has done very well with simvastatin with appropriate medication lowering his lipids.  He has not had any hypertension requiring additional medication management at this time.  Previously with his outpatient visit he had some dizziness and hypotension and was taken off medication management and this resolved most of his symptoms.  Occasionally he has some dizziness but this is nonsignificant at this time.  Patient also has had new onset of palpitations chest pain and neck fluttering.  Occurred significantly and suddenly when he was at home.  He continued to have chest pain and was seen by the EMS.  At that time supraventricular tachycardia was seen and is unclear what the exact rhythm was but he was given adenosine and it Dennison changed his rhythm back to normal sinus rhythm and all of his symptoms resolved within 3 to 5 minutes.  Since then his EKG shows normal sinus rhythm with nonspecific T wave changes and his troponin level has been normal.  Is no evidence of myocardial infarction at this time.  Since that time he has had some telemetry which has shown some pausing but this is been asymptomatic.  There is no evidence of advanced heart block with this pausing.  Past Medical History:  Diagnosis Date  . Anginal pain ([place])   . Depression   . Diabetes mellitus without complication ([place])   . Hypertension   .  Myocardial infarction [facility])       Surgical History:  Past Surgical History:  Procedure Laterality Date  . CARDIAC CATHETERIZATION Left 12/07/2015   Procedure: Left Heart Cath and Cors/Grafts Angiography;  Surgeon: Corey Skains, MD;  Location: [facility];  Service: Cardiovascular;  Laterality: Left;  . CORONARY ARTERY BYPASS GRAFT       Home Meds: Prior to Admission medications   Medication Sig Start Date End Date Taking? Authorizing Provider  allopurinol (ZYLOPRIM) 100 MG tablet Take 100 mg by mouth daily.   Yes [provider]  aspirin EC 81 MG tablet Take 81 mg by mouth daily.   Yes [provider]  glipiZIDE (GLUCOTROL) 10 MG tablet Take 10 mg by mouth 2 (two) times daily before a meal.   Yes [provider]  metFORMIN (GLUCOPHAGE) 1000 MG tablet Take 1,000 mg by mouth daily with breakfast.    Yes [provider]  omeprazole (PRILOSEC) 20 MG capsule Take 20 mg by mouth daily.   Yes [provider]  simvastatin (ZOCOR) 40 MG tablet Take 40 mg by mouth every evening.   Yes [provider]    Inpatient Medications:  . allopurinol  100 mg Oral Daily  . aspirin EC  81 mg Oral Daily  . cyclobenzaprine  5 mg Oral QHS  . docusate sodium  100 mg Oral BID  . enoxaparin (LOVENOX) injection  40 mg Subcutaneous Q24H  . insulin aspart  0-5 Units Subcutaneous QHS  . insulin aspart  0-9 Units Subcutaneous TID WC  . pantoprazole  40  mg Oral Daily  . rOPINIRole  0.25 mg Oral QHS  . simvastatin  40 mg Oral QPM     Allergies:  Allergies  Allergen Reactions  . Ace Inhibitors Cough    Social History   Socioeconomic History  . Marital status: Married    Spouse name: Not on file  . Number of children: Not on file  . Years of education: Not on file  . Highest education level: Not on file  Occupational History  . Not on file  Social Needs  . Financial resource strain: Not on file  . Food insecurity:    Worry: Not on  file    Inability: Not on file  . Transportation needs:    Medical: Not on file    Non-medical: Not on file  Tobacco Use  . Smoking status: Never Smoker  . Smokeless tobacco: Never Used  Substance and Sexual Activity  . Alcohol use: No  . Drug use: No  . Sexual activity: Not on file  Lifestyle  . Physical activity:    Days per week: Not on file    Minutes per session: Not on file  . Stress: Not on file  Relationships  . Social connections:    Talks on phone: Not on file    Gets together: Not on file    Attends religious service: Not on file    Active member of club or organization: Not on file    Attends meetings of clubs or organizations: Not on file    Relationship status: Not on file  . Intimate partner violence:    Fear of current or ex partner: Not on file    Emotionally abused: Not on file    Physically abused: Not on file    Forced sexual activity: Not on file  Other Topics Concern  . Not on file  Social History Narrative  . Not on file     History reviewed. No pertinent family history.   Review of Systems Positive for chest pain shortness of breath Negative for: General:  chills, fever, night sweats or weight changes.  Cardiovascular: PND orthopnea syncope dizziness  Dermatological skin lesions rashes Respiratory: Cough congestion Urologic: Frequent urination urination at night and hematuria Abdominal: negative for nausea, vomiting, diarrhea, bright red blood per rectum, melena, or hematemesis Neurologic: negative for visual changes, and/or hearing changes  All other systems reviewed and are otherwise negative except as noted above.  Labs: Recent Labs    09/03/17 0240  TROPONINI <0.03   Lab Results  Component Value Date   WBC 6.0 09/03/2017   HGB 13.1 09/03/2017   HCT 37.2 (L) 09/03/2017   MCV 96.7 09/03/2017   PLT 181 09/03/2017    Recent Labs  Lab 09/03/17 0240  NA 138  K 4.1  CL 107  CO2 23  BUN 17  CREATININE 1.16  CALCIUM 8.9   GLUCOSE 249*   Lab Results  Component Value Date   CHOL 158 04/25/2012   HDL 77 (H) 04/25/2012   LDLCALC 62 04/25/2012   TRIG 97 04/25/2012   No results found for: DDIMER  Radiology/Studies:  Dg Chest Port 1 View  Result Date: 09/03/2017 CLINICAL DATA:  [age]-year-old male with tachycardia. EXAM: PORTABLE CHEST 1 VIEW COMPARISON:  Chest radiograph dated 01/17/2005 FINDINGS: There is mild emphysema and subpleural interstitial coarsening. No focal consolidation, pleural effusion, or pneumothorax. The cardiac silhouette is within normal limits. Median sternotomy wires and CABG vascular clips. No acute osseous pathology. IMPRESSION: No  active disease. Electronically Signed   By: Anner Crete M.D.   On: 09/03/2017 03:09    EKG: Normal sinus rhythm with nonspecific T wave change  Weights: Filed Weights   09/03/17 0237 09/03/17 0532  Weight: 172 lb 8 oz (78.2 kg) 179 lb 8 oz (81.4 kg)     Physical Exam: Blood pressure 125/81, pulse 61, temperature (!) 97.5 F (36.4 C), temperature source Oral, resp. rate 18, height 5\' 10"  (1.778 m), weight 179 lb 8 oz (81.4 kg), SpO2 99 %. Body mass index is 25.76 kg/m. General: Well developed, well nourished, in no acute distress. Head eyes ears nose throat: Normocephalic, atraumatic, sclera non-icteric, no xanthomas, nares are without discharge. No apparent thyromegaly and/or mass  Lungs: Normal respiratory effort.  no wheezes, no rales, no rhonchi.  Heart: RRR with normal S1 S2. no murmur gallop, no rub, PMI is normal size and placement, carotid upstroke normal without bruit, jugular venous pressure is normal Abdomen: Soft, non-tender, non-distended with normoactive bowel sounds. No hepatomegaly. No rebound/guarding. No obvious abdominal masses. Abdominal aorta is normal size without bruit Extremities: Trace edema. no cyanosis, no clubbing, no ulcers  Peripheral : 2+ bilateral upper extremity pulses, 2+ bilateral femoral pulses, 2+ bilateral  dorsal pedal pulse Neuro: Alert and oriented. No facial asymmetry. No focal deficit. Moves all extremities spontaneously. Musculoskeletal: Normal muscle tone without kyphosis Psych:  Responds to questions appropriately with a normal affect.    Assessment: 77 year old male with known cardiovascular disease status post coronary bypass graft essential hypertension mixed hyperlipidemia having supraventricular tachycardia treated with a Dennison and completely resolved now with some asymptomatic pausing doubt evidence of advanced heart block  Plan: 1.  Continue surveillance for rhythm disturbances including the possibility of advanced heart block and also recurrence of the possibility of supraventricular tachycardia 2.  No additional medication management for above or for blood pressure due to previous recent history of hypotension and dizziness with medication management 3.  Intensity cholesterol therapy 4.  Continue following for improvements of symptoms and following for any further rhythm disturbances causing any issues.  The patient has no symptoms or further advanced heart block is okay for discharged home from the cardiac standpoint with follow-up next week for possible further Holter monitor and assessment and treatment  Signed, Corey Skains M.D. [facility] 09/03/2017, 8:53 AM

## 2017-09-03 NOTE — H&P (Signed)
Johnny [name] is an 77 y.o. male.   Chief Complaint: Chest pain HPI: The patient with past medical history of CAD status post myocardial infarction, diabetes and hypertension presents to the emergency department complaining of chest pain.  The patient awoke with centralized chest pain and shortness of breath.  He states the pain radiated up both sides of his neck and he was very aware that his heart rate was fast.   Telemetry strips showed SVT which resolved in the emergency department.  However, due to risk factors and intermittent shortness of breath emergency department staff called the hospitalist service for admission.  Past Medical History:  Diagnosis Date  . Anginal pain ([place])   . Depression   . Diabetes mellitus without complication ([place])   . Hypertension   . Myocardial infarction [facility])     Past Surgical History:  Procedure Laterality Date  . CARDIAC CATHETERIZATION Left 12/07/2015   Procedure: Left Heart Cath and Cors/Grafts Angiography;  Surgeon: Corey Skains, MD;  Location: [facility];  Service: Cardiovascular;  Laterality: Left;  . CORONARY ARTERY BYPASS GRAFT      History reviewed. No pertinent family history. [place] believes heart disease in some of male family members   Social History:  reports that he has never smoked. He has never used smokeless tobacco. He reports that he does not drink alcohol or use drugs.  Allergies:  Allergies  Allergen Reactions  . Ace Inhibitors Cough    Medications Prior to Admission  Medication Sig Dispense Refill  . allopurinol (ZYLOPRIM) 100 MG tablet Take 100 mg by mouth daily.    Marland Kitchen aspirin EC 81 MG tablet Take 81 mg by mouth daily.    Marland Kitchen glipiZIDE (GLUCOTROL) 10 MG tablet Take 10 mg by mouth 2 (two) times daily before a meal.    . metFORMIN (GLUCOPHAGE) 1000 MG tablet Take 1,000 mg by mouth daily with breakfast.     . omeprazole (PRILOSEC) 20 MG capsule Take 20 mg by mouth daily.    . simvastatin (ZOCOR) 40 MG tablet  Take 40 mg by mouth every evening.      Results for orders placed or performed during the hospital encounter of 09/03/17 (from the past 48 hour(s))  Basic metabolic panel     Status: Abnormal   Collection Time: 09/03/17  2:40 AM  Result Value Ref Range   Sodium 138 135 - 145 mmol/L   Potassium 4.1 3.5 - 5.1 mmol/L   Chloride 107 98 - 111 mmol/L    Comment: Please note change in reference range.   CO2 23 22 - 32 mmol/L   Glucose, Bld 249 (H) 70 - 99 mg/dL    Comment: Please note change in reference range.   BUN 17 8 - 23 mg/dL    Comment: Please note change in reference range.   Creatinine, Ser 1.16 0.61 - 1.24 mg/dL   Calcium 8.9 8.9 - 10.3 mg/dL   GFR calc non Af Amer 58 (L) >60 mL/min   GFR calc Af Amer >60 >60 mL/min    Comment: (NOTE) The eGFR has been calculated using the CKD EPI equation. This calculation has not been validated in all clinical situations. eGFR's persistently <60 mL/min signify possible Chronic Kidney Disease.    Anion gap 8 5 - 15    Comment: Performed at [facility], [address] [place], [place] [address]  CBC     Status: Abnormal   Collection Time: 09/03/17  2:40 AM  Result Value Ref Range   WBC 6.0 3.8 - 10.6 K/uL   RBC 3.85 (L) 4.40 - 5.90 MIL/uL   Hemoglobin 13.1 13.0 - 18.0 g/dL   HCT 37.2 (L) 40.0 - 52.0 %   MCV 96.7 80.0 - 100.0 fL   MCH 34.0 26.0 - 34.0 pg   MCHC 35.1 32.0 - 36.0 g/dL   RDW 12.6 11.5 - 14.5 %   Platelets 181 150 - 440 K/uL    Comment: Performed at [facility], [address] [place], [place] [address]  Troponin I     Status: None   Collection Time: 09/03/17  2:40 AM  Result Value Ref Range   Troponin I <0.03 <0.03 ng/mL    Comment: Performed at [facility], [address] [place], [place] [address]   Dg Chest Port 1 View  Result Date: 09/03/2017 CLINICAL DATA:  [age]-year-old male with tachycardia. EXAM: PORTABLE CHEST 1 VIEW COMPARISON:  Chest radiograph dated 01/17/2005  FINDINGS: There is mild emphysema and subpleural interstitial coarsening. No focal consolidation, pleural effusion, or pneumothorax. The cardiac silhouette is within normal limits. Median sternotomy wires and CABG vascular clips. No acute osseous pathology. IMPRESSION: No active disease. Electronically Signed   By: Anner Crete M.D.   On: 09/03/2017 03:09    Review of Systems  Constitutional: Negative for chills and fever.  HENT: Negative for sore throat and tinnitus.   Eyes: Negative for blurred vision and redness.  Respiratory: Positive for shortness of breath. Negative for cough.   Cardiovascular: Positive for chest pain. Negative for palpitations, orthopnea and PND.  Gastrointestinal: Negative for abdominal pain, diarrhea, nausea and vomiting.  Genitourinary: Negative for dysuria, frequency and urgency.  Musculoskeletal: Negative for joint pain and myalgias.  Skin: Negative for rash.       No lesions  Neurological: Negative for speech change, focal weakness and weakness.  Endo/Heme/Allergies: Does not bruise/bleed easily.       No temperature intolerance  Psychiatric/Behavioral: Negative for depression and suicidal ideas.    Blood pressure 134/87, pulse 64, temperature (!) 97.5 F (36.4 C), temperature source Oral, resp. rate 18, height 5' 10"  (1.778 m), weight 81.4 kg (179 lb 8 oz), SpO2 99 %. Physical Exam  Vitals reviewed. Constitutional: He is oriented to person, place, and time. He appears well-developed and well-nourished. No distress.  HENT:  Head: Normocephalic and atraumatic.  Mouth/Throat: Oropharynx is clear and moist.  Eyes: Pupils are equal, round, and reactive to light. Conjunctivae and EOM are normal. No scleral icterus.  Neck: Normal range of motion. Neck supple. No JVD present. No tracheal deviation present. No thyromegaly present.  Cardiovascular: Normal rate, regular rhythm and normal heart sounds. Exam reveals no gallop and no friction rub.  No murmur  heard. Respiratory: Effort normal and breath sounds normal. No respiratory distress.  GI: Soft. Bowel sounds are normal. He exhibits no distension. There is no tenderness.  Genitourinary:  Genitourinary Comments: Deferred  Musculoskeletal: Normal range of motion. He exhibits no edema.  Lymphadenopathy:    He has no cervical adenopathy.  Neurological: He is alert and oriented to person, place, and time. No cranial nerve deficit.  Skin: Skin is warm and dry. No rash noted. No erythema.  Psychiatric: He has a normal mood and affect. His behavior is normal. Judgment and thought content normal.     Assessment/Plan This is a 77 year old male admitted for chest pain. 1.  Chest pain: Secondary to demand ischemia during tachycardia.  Continue to  follow cardiac biomarkers.  Consult cardiology 2.  SVT: Resolved; heart rate is intermittently bradycardia.  Continue to monitor telemetry 3.  Restless leg syndrome: Patient does not carry this diagnosis but complains of pain and anxiety in his legs.  Start Requip 4.  Diabetes mellitus type 2: Sliding scale insulin while hospitalized.  Hold oral hypoglycemic agents 5.  Hyperlipidemia: Continue statin therapy 6.  DVT prophylaxis: Lovenox 7.  GI prophylaxis: Pantoprazole per home regimen The patient is a full code.  Time spent on admission orders and patient care approximately 45 minutes  Harrie Foreman, MD 09/03/2017, 6:19 AM

## 2017-09-03 NOTE — Progress Notes (Signed)
Text page prime doc for sleeping aid per patient request, Dr. Jannifer Franklin place order for Benadryl. RN will continue to monitor.

## 2017-09-03 NOTE — Progress Notes (Signed)
Dr. Nehemiah Massed notified of pt's 3.25 pause. Patient is asymptomatic. Instructed to notify Dr. Nehemiah Massed if patient becomes symptomatic, but no new orders at this time.

## 2017-09-03 NOTE — Progress Notes (Signed)
Dr. Nehemiah Massed notified in person of pt's SBAR and current pauses, and HR dropping into the 40s. No new orders

## 2017-09-03 NOTE — Care Management Obs Status (Signed)
[facility] NOTIFICATION   Patient Details  Name: Johnny [name] MRN: [id number] Date of Birth: 11-16-1940   Medicare Observation Status Notification Given:  Yes    Shelbie Ammons, RN 09/03/2017, 8:43 AM

## 2017-09-03 NOTE — Plan of Care (Signed)
  Problem: Activity: Goal: Risk for activity intolerance will decrease Outcome: Progressing   Problem: Pain Managment: Goal: General experience of comfort will improve Outcome: Progressing   Problem: Safety: Goal: Ability to remain free from injury will improve Outcome: Progressing   Problem: Education: Goal: Knowledge of General Education information will improve Outcome: Completed/Met

## 2017-09-03 NOTE — Progress Notes (Signed)
Talked to Dr. Jannifer Franklin about patient's 2.55 sinus pause, patient is alseep and asymptomatic. Patient has been having this sinus pauses since this morning, cardiology MD aware. RN will continue to monitor.

## 2017-09-03 NOTE — ED Triage Notes (Signed)
Pt arrives via [facility] with c/o SVT. Pt reports that he awoke with rapid heart rate. Per EMS, pt was 170-175 HR and EMS administered 6 mg Adinosene with pt converting at that time. Pt is alert and oriented at this time.

## 2017-09-03 NOTE — Care Management Note (Signed)
Case Management Note  Patient Details  Name: CARLEN FILS MRN: [id number] Date of Birth: March 21, 1940  Subjective/Objective:   Admitted to [facility] under observation status with the diagnosis of chest pain. Lives with wife, Olegario Shearer [phone]),. Last seen Dr. Harrel Lemon 08/02/17. Prescriptions are filled at [facility] on [address]. Home health in the past. Doesn't remember  Name of agency. No skilled nursing. No home oxygen. Takes care of all basic activities of daily living himself, drives. No falls. Good appetite, Family will transport                Action/Plan: No needs identified at this time, will continue to follow   Expected Discharge Date:                  Expected Discharge Plan:     In-House Referral:     Discharge planning Services     Post Acute Care Choice:    Choice offered to:     DME Arranged:    DME Agency:     HH Arranged:    [place] Agency:     Status of Service:     If discussed at [place] of Stay Meetings, dates discussed:    Additional Comments:  Shelbie Ammons, RN MSN CCM Care Management [phone] 09/03/2017, 9:11 AM

## 2017-09-03 NOTE — Progress Notes (Signed)
[facility] [place] NAME: Johnny [name]    MR#:  [id number]  DATE OF BIRTH:  1940-05-28  SUBJECTIVE:   Patient presented to the hospital due to chest pain/palpitations and was noted to be in SVT.  Received an adenosine by EMS and has converted to a sinus rhythm.  No further episodes of chest pain.  Still having some sinus pauses about 2 to 3 seconds this morning.  No other acute complaints.  REVIEW OF SYSTEMS:    Review of Systems  Constitutional: Negative for chills and fever.  HENT: Negative for congestion and tinnitus.   Eyes: Negative for blurred vision and double vision.  Respiratory: Negative for cough, shortness of breath and wheezing.   Cardiovascular: Negative for chest pain, orthopnea and PND.  Gastrointestinal: Negative for abdominal pain, diarrhea, nausea and vomiting.  Genitourinary: Negative for dysuria and hematuria.  Neurological: Negative for dizziness, sensory change and focal weakness.  All other systems reviewed and are negative.   Nutrition: Heart Healthy/Carb modified Tolerating Diet: yes Tolerating PT: Ambulatory  DRUG ALLERGIES:   Allergies  Allergen Reactions  . Ace Inhibitors Cough    VITALS:  Blood pressure 125/81, pulse 61, temperature (!) 97.5 F (36.4 C), temperature source Oral, resp. rate 18, height 5\' 10"  (1.778 m), weight 81.4 kg (179 lb 8 oz), SpO2 99 %.  PHYSICAL EXAMINATION:   Physical Exam  GENERAL:  77 y.o.-year-old patient lying in bed in no acute distress.  EYES: Pupils equal, round, reactive to light and accommodation. No scleral icterus. Extraocular muscles intact.  HEENT: Head atraumatic, normocephalic. Oropharynx and nasopharynx clear.  NECK:  Supple, no jugular venous distention. No thyroid enlargement, no tenderness.  LUNGS: Normal breath sounds bilaterally, no wheezing, rales, rhonchi. No use of accessory muscles of respiration.  CARDIOVASCULAR: S1, S2 normal. No murmurs, rubs, or  gallops.  ABDOMEN: Soft, nontender, nondistended. Bowel sounds present. No organomegaly or mass.  EXTREMITIES: No cyanosis, clubbing or edema b/l.    NEUROLOGIC: Cranial nerves II through XII are intact. No focal Motor or sensory deficits b/l.   PSYCHIATRIC: The patient is alert and oriented x 3.  SKIN: No obvious rash, lesion, or ulcer.    LABORATORY PANEL:   CBC Recent Labs  Lab 09/03/17 0240  WBC 6.0  HGB 13.1  HCT 37.2*  PLT 181   ------------------------------------------------------------------------------------------------------------------  Chemistries  Recent Labs  Lab 09/03/17 0240  NA 138  K 4.1  CL 107  CO2 23  GLUCOSE 249*  BUN 17  CREATININE 1.16  CALCIUM 8.9   ------------------------------------------------------------------------------------------------------------------  Cardiac Enzymes Recent Labs  Lab 09/03/17 0917  TROPONINI 0.03*   ------------------------------------------------------------------------------------------------------------------  RADIOLOGY:  Dg Chest Port 1 View  Result Date: 09/03/2017 CLINICAL DATA:  [age]-year-old male with tachycardia. EXAM: PORTABLE CHEST 1 VIEW COMPARISON:  Chest radiograph dated 01/17/2005 FINDINGS: There is mild emphysema and subpleural interstitial coarsening. No focal consolidation, pleural effusion, or pneumothorax. The cardiac silhouette is within normal limits. Median sternotomy wires and CABG vascular clips. No acute osseous pathology. IMPRESSION: No active disease. Electronically Signed   By: Anner Crete M.D.   On: 09/03/2017 03:09     ASSESSMENT AND PLAN:   77 year old male with past medical history of hypertension, diabetes, depression, previous history of MI who presents to the hospital due to chest pain, palpitations.  1.  Chest pain- suspected to be secondary to SVT.  Patient developed chest pain earlier this morning and when EMS arrived at his  home he was noted to be in SVT with  heart rates in the 130s to 140s.  Patient was given some adenosine and has converted to a sinus rhythm.  His chest pain has now resolved. - Cardiac markers x2 have so far been negative. - Seen by cardiology and no plans for acute intervention.  Will get echocardiogram.  2.  SVT- source of patient's chest pain.  Now resolved.  No electrolyte abnormalities. - Continue to monitor on telemetry.  3.  Sinus pauses-patient noted to have pauses of 2 to 3 seconds on telemetry.  Asymptomatic. - Await echocardiogram results.  Patient not on any rate controlling meds.  Will watch overnight and consider Holter/loop monitor as an outpatient.  4. DM - cont. SSI  5. Restless leg syndrome - cont. Requip.    6. Hyperlipidemia - cont. Simvastatin.      All the records are reviewed and case discussed with Care Management/Social Worker. Management plans discussed with the patient, family and they are in agreement.  CODE STATUS: Full code  DVT Prophylaxis: Lovenox  TOTAL TIME TAKING CARE OF THIS PATIENT: 30 minutes.   POSSIBLE D/C IN 1-2 DAYS, DEPENDING ON CLINICAL CONDITION.   [name] M.D on 09/03/2017 at 2:21 PM  Between 7am to 6pm - Pager - [phone]  After 6pm go to [email or web address] - Proofreader  [facility]  Office  [phone]  CC: Primary care physician; Baxter Hire, MD

## 2017-09-03 NOTE — Progress Notes (Signed)
Dr. Nehemiah Massed notified of pt's 3.05 second pause

## 2017-09-04 LAB — BASIC METABOLIC PANEL
ANION GAP: 8 (ref 5–15)
BUN: 18 mg/dL (ref 8–23)
CO2: 26 mmol/L (ref 22–32)
Calcium: 9.2 mg/dL (ref 8.9–10.3)
Chloride: 104 mmol/L (ref 98–111)
Creatinine, Ser: 1.21 mg/dL (ref 0.61–1.24)
GFR calc Af Amer: >60 mL/min (ref >60)
GFR, EST NON AFRICAN AMERICAN: 55 mL/min — AB (ref >60)
Glucose, Bld: 264 mg/dL — ABNORMAL HIGH (ref 70–99)
POTASSIUM: 5 mmol/L (ref 3.5–5.1)
SODIUM: 138 mmol/L (ref 135–145)

## 2017-09-04 LAB — GLUCOSE, CAPILLARY
Glucose-Capillary: 228 mg/dL — ABNORMAL HIGH (ref 70–99)
Glucose-Capillary: 219 mg/dL — ABNORMAL HIGH (ref 70–99)

## 2017-09-04 LAB — ECHOCARDIOGRAM COMPLETE
Height: 70 in
WEIGHTICAEL: 2872 [oz_av]

## 2017-09-04 MED ORDER — ROPINIROLE HCL 0.25 MG PO TABS: 0.2500 mg | ORAL_TABLET | Freq: Every day | ORAL | 1 refills | Status: AC

## 2017-09-04 NOTE — Progress Notes (Addendum)
Inpatient Diabetes Program Recommendations  AACE/ADA: New Consensus Statement on Inpatient Glycemic Control (2019)  Target Ranges:  Prepandial:   less than 140 mg/dL      Peak postprandial:   less than 180 mg/dL (1-2 hours)      Critically ill patients:  140 - 180 mg/dL   Results for ARDA, KEADLE (MRN [id number]) as of 09/04/2017 08:38  Ref. Range 09/03/2017 08:03 09/03/2017 12:02 09/03/2017 16:19 09/03/2017 21:09 09/04/2017 07:47  Glucose-Capillary Latest Ref Range: 70 - 99 mg/dL 223 (H) 211 (H) 223 (H) 259 (H) 228 (H)  Results for HARUN, BRUMLEY (MRN [id number]) as of 09/04/2017 08:38  Ref. Range 09/03/2017 02:40  Hemoglobin A1C Latest Ref Range: 4.8 - 5.6 % 9.2 (H)   Review of Glycemic Control  Diabetes history: DM2 Outpatient Diabetes medications: Glipizide 10 mg BID, Metformin 1000 mg QAM Current orders for Inpatient glycemic control: Novolog 0-9 units TID with meals, Novolog 0-5 units QHS  Inpatient Diabetes Program Recommendations:  Insulin - Basal: Please consider ordering Lantus 12 units daily (based on 81 kg x 0.15 units). HgbA1C: A1C 9.2% on 09/03/17 indicating an average glucose of 217 mg/dl over the past 2-3 months. Noted in [place] that prior A1C was 8.3% on 07/26/17. Also noted patient seen PCP on 08/02/17 for bronchitis and was prescribed steroids which are contributing to elevated A1C.  Addendum 09/04/17@11 :50-Spoke with patient about diabetes and home regimen for diabetes control. Patient reports that he is followed by PCP for diabetes management and currently he takes Glipizide 10 mg BID and Metformin 1000 mg QAM as an outpatient for diabetes control. Patient reports that he is taking DM medications as prescribed.  Patient states that he can not tolerate taking Metformin twice a day because in the past when he did he had nausea and vomiting. Patient states that he checks his glucose every other day.  Patient confirms that he was on a 10 day Prednisone taper last month and he  has noted glucose has been running higher over the past few weeks (180-200 mg/dl fasting). Patient states that prior to using the Prednisone he reports that his glucose was usually less than 180 mg/dl. Discussed A1C results (9.2% on 09/03/17) and explained that his current A1C indicates an average glucose of 217 mg/dl over the past 2-3 months. Explained that recent steroid use has contributed to elevated A1C.  Discussed glucose and A1C goals. Discussed importance of checking CBGs and maintaining good CBG control to prevent long-term and short-term complications. Explained how hyperglycemia leads to damage within blood vessels which lead to the common complications seen with uncontrolled diabetes. Discussed impact of nutrition, exercise, stress, sickness, and medications on diabetes control. Encouraged patient to continue checking his glucose as his PCP instructs him.  Explained how PCP can use glucose trends to help with making adjustments with DM medications. Patient verbalized understanding of information discussed and he states that he has no further questions at this time related to diabetes.  Thanks, Barnie Alderman, RN, MSN, CDE Diabetes Coordinator Inpatient Diabetes Program [phone] (Team Pager from 8am to 5pm)

## 2017-09-04 NOTE — Discharge Summary (Signed)
[facility] [place] NAME: Johnny [name]    MR#:  [id number]  DATE OF BIRTH:  Sep 08, 1940  DATE OF ADMISSION:  09/03/2017 ADMITTING PHYSICIAN: Harrie Foreman, MD  DATE OF DISCHARGE: 09/04/2017 12:56 PM  PRIMARY CARE PHYSICIAN: Baxter Hire, MD    ADMISSION DIAGNOSIS:  Pain [R52]  DISCHARGE DIAGNOSIS:  Active Problems:   Chest pain   SECONDARY DIAGNOSIS:   Past Medical History:  Diagnosis Date  . Anginal pain ([place])   . Depression   . Diabetes mellitus without complication ([place])   . Hypertension   . Myocardial infarction [facility])     HOSPITAL COURSE:   77 year old male with past medical history of hypertension, diabetes, depression, previous history of MI who presents to the hospital due to chest pain, palpitations.  1.  Chest pain- suspected to be secondary to SVT.  Patient developed SVT at home and on route via EMS received some adenosine and converted to normal sinus rhythm. -She was observed on telemetry his cardiac markers remain negative, he had no further episodes of SVT is clinically asymptomatic and therefore being discharged home. - Had echocardiogram done which showed LV dysfunction of 35 to 40% which is not new based of his cardiac catheterization which showed EF of 40% in 2017 with inferior hypokinesis.  Patient was seen by cardiology who did not recommend any new intervention and therefore he was discharged home.  2.  SVT- source of patient's chest pain.  Now resolved.   -Now resolved.  No electrolyte abnormalities.  3.  Sinus pauses-patient noted to have pauses of 2 to 3 seconds on telemetry.    This resolved overnight in the hospital.  He has had no further pauses overnight.  Patient was seen by cardiology who recommended outpatient Holter monitor.  4. DM -while in the hospital patient was on sliding scale insulin, not being discharged on metformin and glipizide.  5. Restless leg syndrome -patient was placed on  some Requip and discharged home with a prescription for Requip which he said helped him.   6. Hyperlipidemia - he will cont. Simvastatin.     DISCHARGE CONDITIONS:   Stable  CONSULTS OBTAINED:    DRUG ALLERGIES:   Allergies  Allergen Reactions  . Ace Inhibitors Cough    DISCHARGE MEDICATIONS:   Allergies as of 09/04/2017      Reactions   Ace Inhibitors Cough      Medication List    TAKE these medications   allopurinol 100 MG tablet Commonly known as:  ZYLOPRIM Take 100 mg by mouth daily.   aspirin EC 81 MG tablet Take 81 mg by mouth daily.   glipiZIDE 10 MG tablet Commonly known as:  GLUCOTROL Take 10 mg by mouth 2 (two) times daily before a meal.   metFORMIN 1000 MG tablet Commonly known as:  GLUCOPHAGE Take 1,000 mg by mouth daily with breakfast.   omeprazole 20 MG capsule Commonly known as:  PRILOSEC Take 20 mg by mouth daily.   rOPINIRole 0.25 MG tablet Commonly known as:  REQUIP Take 1 tablet (0.25 mg total) by mouth at bedtime.   simvastatin 40 MG tablet Commonly known as:  ZOCOR Take 40 mg by mouth every evening.         DISCHARGE INSTRUCTIONS:   DIET:  Cardiac diet and Diabetic diet  DISCHARGE CONDITION:  Stable  ACTIVITY:  Activity as tolerated  OXYGEN:  Home Oxygen: No.   Oxygen Delivery: room air  DISCHARGE LOCATION:  home   If you experience worsening of your admission symptoms, develop shortness of breath, life threatening emergency, suicidal or homicidal thoughts you must seek medical attention immediately by calling 911 or calling your MD immediately  if symptoms less severe.  You Must read complete instructions/literature along with all the possible adverse reactions/side effects for all the Medicines you take and that have been prescribed to you. Take any new Medicines after you have completely understood and accpet all the possible adverse reactions/side effects.   Please note  You were cared for by a hospitalist  during your hospital stay. If you have any questions about your discharge medications or the care you received while you were in the hospital after you are discharged, you can call the unit and asked to speak with the hospitalist on call if the hospitalist that took care of you is not available. Once you are discharged, your primary care physician will handle any further medical issues. Please note that NO REFILLS for any discharge medications will be authorized once you are discharged, as it is imperative that you return to your primary care physician (or establish a relationship with a primary care physician if you do not have one) for your aftercare needs so that they can reassess your need for medications and monitor your lab values.     Today   No further chest pain, no pauses overnight.  Feels back to baseline.  Will discharge home today.  VITAL SIGNS:  Blood pressure 137/73, pulse (!) 58, temperature (!) 97.5 F (36.4 C), temperature source Oral, resp. rate 16, height 5\' 10"  (1.778 m), weight 81.1 kg (178 lb 12.8 oz), SpO2 98 %.  I/O:    Intake/Output Summary (Last 24 hours) at 09/04/2017 1600 Last data filed at 09/04/2017 1022 Gross per 24 hour  Intake 600 ml  Output 1650 ml  Net -1050 ml    PHYSICAL EXAMINATION:  GENERAL:  77 y.o.-year-old patient lying in the bed with no acute distress.  EYES: Pupils equal, round, reactive to light and accommodation. No scleral icterus. Extraocular muscles intact.  HEENT: Head atraumatic, normocephalic. Oropharynx and nasopharynx clear.  NECK:  Supple, no jugular venous distention. No thyroid enlargement, no tenderness.  LUNGS: Normal breath sounds bilaterally, no wheezing, rales,rhonchi. No use of accessory muscles of respiration.  CARDIOVASCULAR: S1, S2 normal. No murmurs, rubs, or gallops.  ABDOMEN: Soft, non-tender, non-distended. Bowel sounds present. No organomegaly or mass.  EXTREMITIES: No pedal edema, cyanosis, or clubbing.   NEUROLOGIC: Cranial nerves II through XII are intact. No focal motor or sensory defecits b/l.  PSYCHIATRIC: The patient is alert and oriented x 3.  SKIN: No obvious rash, lesion, or ulcer.   DATA REVIEW:   CBC Recent Labs  Lab 09/03/17 0240  WBC 6.0  HGB 13.1  HCT 37.2*  PLT 181    Chemistries  Recent Labs  Lab 09/04/17 0502  NA 138  K 5.0  CL 104  CO2 26  GLUCOSE 264*  BUN 18  CREATININE 1.21  CALCIUM 9.2    Cardiac Enzymes Recent Labs  Lab 09/03/17 2125  TROPONINI <0.03    Microbiology Results  No results found for this or any previous visit.  RADIOLOGY:  Dg Chest Port 1 View  Result Date: 09/03/2017 CLINICAL DATA:  [age]-year-old male with tachycardia. EXAM: PORTABLE CHEST 1 VIEW COMPARISON:  Chest radiograph dated 01/17/2005 FINDINGS: There is mild emphysema and subpleural interstitial coarsening. No focal consolidation, pleural effusion, or pneumothorax. The  cardiac silhouette is within normal limits. Median sternotomy wires and CABG vascular clips. No acute osseous pathology. IMPRESSION: No active disease. Electronically Signed   By: Anner Crete M.D.   On: 09/03/2017 03:09      Management plans discussed with the patient, family and they are in agreement.  CODE STATUS:     Code Status Orders  (From admission, onward)        Start     Ordered   09/03/17 0527  Full code  Continuous     09/03/17 0526    Code Status History    Date Active Date Inactive Code Status Order ID Comments User Context   12/07/2015 0847 12/07/2015 1330 Full Code [id number]  Corey Skains, MD Inpatient     TOTAL TIME TAKING CARE OF THIS PATIENT: 40 minutes.    [name] M.D on 09/04/2017 at 4:00 PM  Between 7am to 6pm - Pager - [phone]  After 6pm go to [email or web address] - Proofreader  [facility]  Office  [phone]  CC: Primary care physician; Baxter Hire, MD

## 2017-09-04 NOTE — Progress Notes (Signed)
[facility] Cardiology [facility] Encounter Note  Patient: Johnny [name] / Admit Date: 09/03/2017 / Date of Encounter: 09/04/2017, 9:07 AM   Subjective: No further episodes of tachycardia chest pain and/or neck palpitations with no evidence of supraventricular tachycardia by telemetry.  Patient had minor pauses without evidence of symptoms yesterday but no other concerns today.  Patient ambulating well without symptoms.  No evidence of myocardial infarction or congestive heart failure  Review of Systems: Positive for: None Negative for: Vision change, hearing change, syncope, dizziness, nausea, vomiting,diarrhea, bloody stool, stomach pain, cough, congestion, diaphoresis, urinary frequency, urinary pain,skin lesions, skin rashes Others previously listed  Objective: Telemetry: Normal sinus rhythm Physical Exam: Blood pressure 137/73, pulse (!) 58, temperature (!) 97.5 F (36.4 C), temperature source Oral, resp. rate 16, height 5\' 10"  (1.778 m), weight 178 lb 12.8 oz (81.1 kg), SpO2 98 %. Body mass index is 25.66 kg/m. General: Well developed, well nourished, in no acute distress. Head: Normocephalic, atraumatic, sclera non-icteric, no xanthomas, nares are without discharge. Neck: No apparent masses Lungs: Normal respirations with no wheezes, no rhonchi, no rales , no crackles   Heart: Regular rate and rhythm, normal S1 S2, no murmur, no rub, no gallop, PMI is normal size and placement, carotid upstroke normal without bruit, jugular venous pressure normal Abdomen: Soft, non-tender, non-distended with normoactive bowel sounds. No hepatosplenomegaly. Abdominal aorta is normal size without bruit Extremities: No edema, no clubbing, no cyanosis, no ulcers,  Peripheral: 2+ radial, 2+ femoral, 2+ dorsal pedal pulses Neuro: Alert and oriented. Moves all extremities spontaneously. Psych:  Responds to questions appropriately with a normal affect.   Intake/Output Summary (Last 24 hours) at  09/04/2017 0907 Last data filed at 09/04/2017 0810 Gross per 24 hour  Intake 720 ml  Output 1650 ml  Net -930 ml    Inpatient Medications:  . allopurinol  100 mg Oral Daily  . aspirin EC  81 mg Oral Daily  . cyclobenzaprine  5 mg Oral QHS  . docusate sodium  100 mg Oral BID  . enoxaparin (LOVENOX) injection  40 mg Subcutaneous Q24H  . insulin aspart  0-5 Units Subcutaneous QHS  . insulin aspart  0-9 Units Subcutaneous TID WC  . pantoprazole  40 mg Oral Daily  . rOPINIRole  0.25 mg Oral QHS  . simvastatin  40 mg Oral QPM   Infusions:   Labs: Recent Labs    09/03/17 0240 09/04/17 0502  NA 138 138  K 4.1 5.0  CL 107 104  CO2 23 26  GLUCOSE 249* 264*  BUN 17 18  CREATININE 1.16 1.21  CALCIUM 8.9 9.2   No results for input(s): AST, ALT, ALKPHOS, BILITOT, PROT, ALBUMIN in the last 72 hours. Recent Labs    09/03/17 0240  WBC 6.0  HGB 13.1  HCT 37.2*  MCV 96.7  PLT 181   Recent Labs    09/03/17 0240 09/03/17 0917 09/03/17 1517 09/03/17 2125  TROPONINI <0.03 0.03* <0.03 <0.03   Invalid input(s): POCBNP Recent Labs    09/03/17 0240  HGBA1C 9.2*     Weights: Filed Weights   09/03/17 0237 09/03/17 0532 09/04/17 0332  Weight: 172 lb 8 oz (78.2 kg) 179 lb 8 oz (81.4 kg) 178 lb 12.8 oz (81.1 kg)     Radiology/Studies:  Dg Chest Port 1 View  Result Date: 09/03/2017 CLINICAL DATA:  [age]-year-old male with tachycardia. EXAM: PORTABLE CHEST 1 VIEW COMPARISON:  Chest radiograph dated 01/17/2005 FINDINGS: There is mild emphysema and subpleural  interstitial coarsening. No focal consolidation, pleural effusion, or pneumothorax. The cardiac silhouette is within normal limits. Median sternotomy wires and CABG vascular clips. No acute osseous pathology. IMPRESSION: No active disease. Electronically Signed   By: Anner Crete M.D.   On: 09/03/2017 03:09     Assessment and Recommendation  77 y.o. 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  Minor pauses by telemetry without symptoms 1.  Begin ambulation and follow for improvements of symptoms 2.  No beta-blocker for heart rate control due to concerns of sinus pauses 3.  Avoid other antihypertensives due to hypotension and dizziness in the recent past 4.  High intensity cholesterol therapy 5.  Okay for discharge home from cardiac standpoint with no need for further diagnostics at this time with follow-up next week  Signed, Serafina Royals M.D. FACC

## 2017-09-04 NOTE — Plan of Care (Signed)
  Problem: Health Behavior/Discharge Planning: Goal: Ability to manage health-related needs will improve Outcome: Adequate for Discharge   Problem: Clinical Measurements: Goal: Ability to maintain clinical measurements within normal limits will improve Outcome: Adequate for Discharge Goal: Will remain free from infection Outcome: Adequate for Discharge Goal: Diagnostic test results will improve Outcome: Adequate for Discharge Goal: Respiratory complications will improve Outcome: Adequate for Discharge Goal: Cardiovascular complication will be avoided Outcome: Adequate for Discharge   Problem: Activity: Goal: Risk for activity intolerance will decrease Outcome: Adequate for Discharge   Problem: Elimination: Goal: Will not experience complications related to bowel motility Outcome: Adequate for Discharge Goal: Will not experience complications related to urinary retention Outcome: Adequate for Discharge   Problem: Pain Managment: Goal: General experience of comfort will improve Outcome: Adequate for Discharge   Problem: Safety: Goal: Ability to remain free from injury will improve Outcome: Adequate for Discharge

## 2017-09-04 NOTE — Progress Notes (Signed)
Patient given discharge instructions with family at bedside. Patient and family verbalized understanding with no further questions. Both IV's taken out and tele monitor off. Patient eating lunch. Will discharge home via family vehicle after lunch.

## 2017-09-11 NOTE — ED Provider Notes (Signed)
[facility] Emergency Department Provider Note ______________   First MD Initiated Contact with Patient 09/03/17 [phone]     (approximate)  I have reviewed the triage vital signs and the nursing notes.   HISTORY  Chief Complaint Chest Pain    HPI Johnny [name] is a 77 y.o. male with below list of chronic medical conditions including hypertension diabetes and previous myocardial infarction presents to the emergency department via EMS with complaint of acute onset of chest pain and dyspnea on awakening accompanied by rapid heart rate.  Per EMS on their arrival patient was noted to be in SVT and as such 6 mg of adenosine was administered with the patient converting to normal sinus rhythm following.  Patient admits to continued mild chest discomfort at this time no dyspnea.  Patient denies any lightheadedness nausea or vomiting.   Past Medical History:  Diagnosis Date  . Anginal pain ([place])   . Depression   . Diabetes mellitus without complication ([place])   . Hypertension   . Myocardial infarction [facility])     Patient Active Problem List   Diagnosis Date Noted  . Chest pain 09/03/2017  . Unstable angina ([place]) 11/23/2015    Past Surgical History:  Procedure Laterality Date  . CARDIAC CATHETERIZATION Left 12/07/2015   Procedure: Left Heart Cath and Cors/Grafts Angiography;  Surgeon: Corey Skains, MD;  Location: [place] CV [facility];  Service: Cardiovascular;  Laterality: Left;  . CORONARY ARTERY BYPASS GRAFT      Prior to Admission medications   Medication Sig Start Date End Date Taking? Authorizing Provider  allopurinol (ZYLOPRIM) 100 MG tablet Take 100 mg by mouth daily.   Yes [provider]  aspirin EC 81 MG tablet Take 81 mg by mouth daily.   Yes [provider]  glipiZIDE (GLUCOTROL) 10 MG tablet Take 10 mg by mouth 2 (two) times daily before a meal.   Yes [provider]  metFORMIN (GLUCOPHAGE) 1000 MG tablet Take 1,000  mg by mouth daily with breakfast.    Yes [provider]  omeprazole (PRILOSEC) 20 MG capsule Take 20 mg by mouth daily.   Yes [provider]  simvastatin (ZOCOR) 40 MG tablet Take 40 mg by mouth every evening.   Yes [provider]  rOPINIRole (REQUIP) 0.25 MG tablet Take 1 tablet (0.25 mg total) by mouth at bedtime. 09/04/17 11/03/17  [name], MD    Allergies Ace inhibitors  History reviewed. No pertinent family history.  Social History Social History   Tobacco Use  . Smoking status: Never Smoker  . Smokeless tobacco: Never Used  Substance Use Topics  . Alcohol use: No  . Drug use: No    Review of Systems Constitutional: No fever/chills Eyes: No visual changes. ENT: No sore throat. Cardiovascular: Positive for chest pain, dyspnea, rapid heartbeat. Respiratory: Denies shortness of breath. Gastrointestinal: No abdominal pain.  No nausea, no vomiting.  No diarrhea.  No constipation. Genitourinary: Negative for dysuria. Musculoskeletal: Negative for neck pain.  Negative for back pain. Integumentary: Negative for rash. Neurological: Negative for headaches, focal weakness or numbness.   ____________________________________________   PHYSICAL EXAM:  VITAL SIGNS: ED Triage Vitals  Enc Vitals Group     BP 09/03/17 0235 119/76     Pulse Rate 09/03/17 0235 81     Resp 09/03/17 0235 17     Temp 09/03/17 0240 (!) 97.4 F (36.3 C)     Temp Source 09/03/17 0240 Oral  SpO2 09/03/17 0235 98 %     Weight 09/03/17 0237 78.2 kg (172 lb 8 oz)     Height 09/03/17 0237 1.778 m (5\' 10" )     Head Circumference --      Peak Flow --      Pain Score 09/03/17 0237 0     Pain Loc --      Pain Edu? --      Excl. in [place]? --      Constitutional: Alert and oriented. Well appearing and in no acute distress. Eyes: Conjunctivae are normal. Head: Atraumatic. Mouth/Throat: Mucous membranes are moist.  Oropharynx non-erythematous. Neck: No stridor.     Cardiovascular: Normal rate, regular rhythm. Good peripheral circulation. Grossly normal heart sounds. Respiratory: Normal respiratory effort.  No retractions. Lungs CTAB. Gastrointestinal: Soft and nontender. No distention.  Musculoskeletal: No lower extremity tenderness nor edema. No gross deformities of extremities. Neurologic:  Normal speech and language. No gross focal neurologic deficits are appreciated.  Skin:  Skin is warm, dry and intact. No rash noted. Psychiatric: Mood and affect are normal. Speech and behavior are normal.  ____________________________________________   LABS (all labs ordered are listed, but only abnormal results are displayed)  Labs Reviewed  BASIC METABOLIC PANEL - Abnormal; Notable for the following components:      Result Value   Glucose, Bld 249 (*)    GFR calc non Af Amer 58 (*)    All other components within normal limits  CBC - Abnormal; Notable for the following components:   RBC 3.85 (*)    HCT 37.2 (*)    All other components within normal limits  TROPONIN I - Abnormal; Notable for the following components:   Troponin I 0.03 (*)    All other components within normal limits  HEMOGLOBIN A1C - Abnormal; Notable for the following components:   Hgb A1c MFr Bld 9.2 (*)    All other components within normal limits  GLUCOSE, CAPILLARY - Abnormal; Notable for the following components:   Glucose-Capillary 223 (*)    All other components within normal limits  GLUCOSE, CAPILLARY - Abnormal; Notable for the following components:   Glucose-Capillary 211 (*)    All other components within normal limits  GLUCOSE, CAPILLARY - Abnormal; Notable for the following components:   Glucose-Capillary 223 (*)    All other components within normal limits  BASIC METABOLIC PANEL - Abnormal; Notable for the following components:   Glucose, Bld 264 (*)    GFR calc non Af Amer 55 (*)    All other components within normal limits  GLUCOSE, CAPILLARY - Abnormal;  Notable for the following components:   Glucose-Capillary 259 (*)    All other components within normal limits  GLUCOSE, CAPILLARY - Abnormal; Notable for the following components:   Glucose-Capillary 228 (*)    All other components within normal limits  GLUCOSE, CAPILLARY - Abnormal; Notable for the following components:   Glucose-Capillary 219 (*)    All other components within normal limits  TROPONIN I  TROPONIN I  TROPONIN I  TSH   ____________________________________________  EKG  ED ECG REPORT I, Lyman N Keontae Levingston, the attending physician, personally viewed and interpreted this ECG.   Date: 09/11/2017  EKG Time: 2:35 AM  Rate: 81  Rhythm: Normal sinus rhythm  Axis: Normal  Intervals: Normal  ST&T Change: None  ____________________________________________  RADIOLOGY I, Page N Latron Ribas, personally viewed and evaluated these images (plain radiographs) as part of my medical decision making, as well  as reviewing the written report by the radiologist.  ED MD interpretation: Chest x-ray: No active disease per radiologist.    Procedures   ____________________________________________   INITIAL IMPRESSION / ASSESSMENT AND PLAN / ED COURSE  As part of my medical decision making, I reviewed the following data within the electronic MEDICAL RECORD NUMBER   77 year old male presenting with above-stated history and physical exam of chest pain dyspnea and SVT noted via EMS resolved following adenosine.  Review of the patient's EKG and rhythm strip before arrival to the emergency department is consistent with SVT clearly showing when rhythm was converted to a normal sinus rhythm on rhythm strip.  Given patient's multiple risk factors and SVT in conjunction with chest pain and dyspnea patient discussed with Dr. Marcille Blanco for further hospital evaluation and management.  Patient was given 3 and 24 mg of aspirin on arrival to the emergency department.     ____________________________________________  FINAL CLINICAL IMPRESSION(S) / ED DIAGNOSES  Final diagnoses:  Pain  Type 2 diabetes mellitus without complication, without long-term current use of insulin ([place])     MEDICATIONS GIVEN DURING THIS VISIT:  Medications  aspirin chewable tablet 324 mg (324 mg Oral Given 09/03/17 0247)  ondansetron (ZOFRAN) injection 4 mg (4 mg Intravenous Not Given 09/03/17 0527)     ED Discharge Orders        Ordered    rOPINIRole (REQUIP) 0.25 MG tablet  Daily at bedtime     09/04/17 0930    Activity as tolerated - No restrictions     09/04/17 0930    Diet - low sodium heart healthy     09/04/17 0930    Diet Carb Modified     09/04/17 0930       Note:  This document was prepared using Dragon voice recognition software and may include unintentional dictation errors.    Gregor Hams, MD 09/11/17 2250

## 2017-10-30 DIAGNOSIS — I2510 Atherosclerotic heart disease of native coronary artery without angina pectoris: Secondary | ICD-10-CM | POA: Insufficient documentation

## 2017-10-30 DIAGNOSIS — I471 Supraventricular tachycardia: Secondary | ICD-10-CM | POA: Insufficient documentation

## 2017-10-30 NOTE — Progress Notes (Signed)
Cardiology Office Note  Date:  11/02/2017   ID:  Johnny [name], DOB 1941/01/26, MRN [id number]  PCP:  Johnny Hire, MD   Chief Complaint  Patient presents with  . other    Self ref for second opinion for a history of CAD s/p CABG x 3 in 1998 at [facility]. Patient was followed at [facility] clinic by Dr. Nehemiah Massed recent Echo 09/03/17 at [facility] clinic.  Pt. c/o chest tightness this am and last night, shortness of breath with any amount of exertion and at rest and has dizziness daily.     HPI:  [name] is a 77 y.o. male with  MI 82s CAD, CABG 1998, cardiac arrest following the procedure Ischemic cardiomyopathy ejection fraction 35 to 40% on echocardiogram 2019 Cath 11/2015 patent grafts x3 hyperlipidemia  SVT, adenosine 08/2017 Diabetes Who presents to establish care in the [facility] office for his coronary disease, shortness of breath  Major issues to discuss today are worsening shortness of breath and severe orthostasis  Recent hospitalization Hospital records reviewed with the patient in detail  new onset of palpitations chest pain and neck fluttering 09/03/2017  suddenly when he was at home.  seen by the EMS.   Possible SVT,  given adenosine  resolved within 3 to 5 minutes.     echocardiogram July 2019  LV dysfunction of 35 to 40% cardiac catheterization which showed EF of 40% in 2017  Remote weight loss from pancreas Chronic dizziness, since that time Used to be on losartan, medication was weaned off but continues to have orthostasis that is debilitating Feels it is affecting his life and making his balance worse  Shortness of breath is getting worse and he is uncertain if this is from deconditioning and inactivity or some other problem Not sure if he wants ischemic work-up at this time  EKG personally reviewed by myself on todays visit Shows normal sinus rhythm rate 81 bpm old inferior MI  Orthostatics performed today  Supine 109/69 heart rate 79 Sitting 109/65 heart  rate 83 Standing 91/60 heart rate 92 Standing after 3 minutes 96/65 heart rate 94 Was dizzy with standing  Other records reviewed Prior cardiac catheterization results from 2017  [place]amus lesion, 95 %stenosed.  Mid RCA lesion, 100 %stenosed.  SVG graft was visualized by angiography and is small.  Origin to Prox Graft lesion, 50 %stenosed.  SVG graft was visualized by angiography and is normal in caliber and anatomically normal.  Post Atrio lesion, 100 %stenosed.  Ost Cx to Mid Cx lesion, 40 %stenosed.  Dist RCA lesion, 100 %stenosed.  Mid LAD lesion, 85 %stenosed.  LM lesion, 30 %stenosed.  Ost 1st Mrg to 1st Mrg lesion, 65 %stenosed.  LIMA graft was visualized by angiography and is normal in caliber and anatomically normal.       PMH:   has a past medical history of Anginal pain ([place]), Coronary artery disease (1998), Depression, Diabetes mellitus without complication ([place]), Hyperlipidemia, Hypertension, Myocardial infarction ([place]), and SVT (supraventricular tachycardia) ([place]).  PSH:    Past Surgical History:  Procedure Laterality Date  . CARDIAC CATHETERIZATION Left 12/07/2015   Procedure: Left Heart Cath and Cors/Grafts Angiography;  Surgeon: Corey Skains, MD;  Location: [place] CV LAB;  Service: Cardiovascular;  Laterality: Left;  . CORONARY ARTERY BYPASS GRAFT  1998   [facility]    Current Outpatient Medications  Medication Sig Dispense Refill  . allopurinol (ZYLOPRIM) 100 MG tablet Take 100 mg by mouth daily.    Marland Kitchen  aspirin EC 81 MG tablet Take 81 mg by mouth daily.    Marland Kitchen glipiZIDE (GLUCOTROL) 10 MG tablet Take 10 mg by mouth 2 (two) times daily before a meal.    . metFORMIN (GLUCOPHAGE) 1000 MG tablet Take 1,000 mg by mouth daily with breakfast.     . omeprazole (PRILOSEC) 20 MG capsule Take 20 mg by mouth daily.    Marland Kitchen rOPINIRole (REQUIP) 0.25 MG tablet Take 1 tablet (0.25 mg total) by mouth at bedtime. 30 tablet 1  . sertraline (ZOLOFT) 100 MG tablet Take  100 mg by mouth daily.    . simvastatin (ZOCOR) 40 MG tablet Take 40 mg by mouth every evening.    . midodrine (PROAMATINE) 10 MG tablet Take 1 tablet (10 mg total) by mouth 3 (three) times daily as needed. 90 tablet 3   No current facility-administered medications for this visit.      Allergies:   Ace inhibitors   Social History:  The patient  reports that he quit smoking about 39 years ago. His smoking use included cigarettes. He has a 37.50 pack-year smoking history. He has never used smokeless tobacco. He reports that he does not drink alcohol or use drugs.   Family History:   family history is not on file.    Review of Systems: Review of Systems  Constitutional: Positive for malaise/fatigue.  Respiratory: Positive for shortness of breath.   Cardiovascular: Negative.   Gastrointestinal: Negative.   Musculoskeletal: Negative.   Neurological: Positive for dizziness.  Psychiatric/Behavioral: Negative.   All other systems reviewed and are negative.   PHYSICAL EXAM: VS:  BP 100/60 (BP Location: Right Arm, Patient Position: Sitting, Cuff Size: Normal)   Pulse 81   Ht 5\' 10"  (1.778 m)   Wt 179 lb 8 oz (81.4 kg)   BMI 25.76 kg/m  , BMI Body mass index is 25.76 kg/m. GEN: Well nourished, well developed, in no acute distress  HEENT: normal  Neck: no JVD, carotid bruits, or masses Cardiac: RRR; no murmurs, rubs, or gallops,no edema  Respiratory:  clear to auscultation bilaterally, normal work of breathing GI: soft, nontender, nondistended, + BS MS: no deformity or atrophy  Skin: warm and dry, no rash Neuro:  Strength and sensation are intact Psych: euthymic mood, full affect   Recent Labs: 09/03/2017: Hemoglobin 13.1; Platelets 181; TSH 2.490 09/04/2017: BUN 18; Creatinine, Ser 1.21; Potassium 5.0; Sodium 138    Lipid Panel Lab Results  Component Value Date   CHOL 158 04/25/2012   HDL 77 (H) 04/25/2012   LDLCALC 62 04/25/2012   TRIG 97 04/25/2012      Wt Readings  from Last 3 Encounters:  11/02/17 179 lb 8 oz (81.4 kg)  09/04/17 178 lb 12.8 oz (81.1 kg)  12/07/15 190 lb (86.2 kg)       ASSESSMENT AND PLAN:  Coronary artery disease of native artery of native heart with stable angina pectoris (HCC) - Plan: EKG 12-Lead He does have worsening shortness of breath which is concerning for ischemia Declining stress testing at this time.  There could be done at a later date Last cardiac catheterization 2 years ago  SVT (supraventricular tachycardia) ([place]) - Plan: EKG 12-Lead Previously given adenosine Long discussion, if he has recurrent symptoms could possibly try antiarrhythmic  Shortness of breath Etiology unclear.  Deconditioned, no regular exercise program Though unable to exclude ischemia Declining stress test at this time, recommended if he would like this done to call our office.  This would  need to be a lexiscan Myoview Recommended regular walking program  Orthostasis Having significant symptoms from orthostasis Possibly exacerbated by rapid weight loss when he had pancreatitis several years ago We will avoid [place] for blood pressure support given his shortness of breath symptoms and low ejection fraction Recommend he try midodrine 5 up to 10 mg as needed in the morning and after lunch  Mixed hyperlipidemia Cholesterol is at goal on the current lipid regimen. No changes to the medications were made.  Ischemic cardiomyopathy Unable to start any medications for cardiomyopathy given low blood pressure He is orthostatic May need further work-up such as stress testing if symptoms of shortness of breath gets worse  Disposition:   F/U  6 months   Total encounter time more than 60 minutes  Greater than 50% was spent in counseling and coordination of care with the patient    Orders Placed This Encounter  Procedures  . EKG 12-Lead     Signed, Esmond Plants, M.D., Ph.D. 11/02/2017  [place],  [place]

## 2017-11-01 DIAGNOSIS — F32A Depression, unspecified: Secondary | ICD-10-CM | POA: Insufficient documentation

## 2017-11-01 DIAGNOSIS — F329 Major depressive disorder, single episode, unspecified: Secondary | ICD-10-CM | POA: Insufficient documentation

## 2017-11-02 ENCOUNTER — Encounter: Payer: Self-pay | Admitting: Cardiovascular Disease

## 2017-11-02 ENCOUNTER — Ambulatory Visit: Payer: Medicare Other | Admitting: Cardiovascular Disease

## 2017-11-02 ENCOUNTER — Encounter

## 2017-11-02 VITALS — BP 100/60 | HR 81 | Ht 70.0 in | Wt 179.5 lb

## 2017-11-02 DIAGNOSIS — I471 Supraventricular tachycardia: Secondary | ICD-10-CM

## 2017-11-02 DIAGNOSIS — I951 Orthostatic hypotension: Secondary | ICD-10-CM | POA: Diagnosis not present

## 2017-11-02 DIAGNOSIS — R0602 Shortness of breath: Secondary | ICD-10-CM | POA: Diagnosis not present

## 2017-11-02 DIAGNOSIS — E782 Mixed hyperlipidemia: Secondary | ICD-10-CM

## 2017-11-02 DIAGNOSIS — I255 Ischemic cardiomyopathy: Secondary | ICD-10-CM

## 2017-11-02 DIAGNOSIS — I25118 Atherosclerotic heart disease of native coronary artery with other forms of angina pectoris: Secondary | ICD-10-CM

## 2017-11-02 MED ORDER — MIDODRINE HCL 10 MG PO TABS: 10.0000 mg | ORAL_TABLET | Freq: Three times a day (TID) | ORAL | 3 refills | Status: DC | PRN

## 2017-11-02 NOTE — Patient Instructions (Addendum)
Medication Instructions:   Please take midodrine one pill in the early morning And one pill after your nap (1 to 2 pm)  Stay hydrated Don't avoid salt  Labwork:  No new labs needed  Testing/Procedures:  No further testing at this time  Please call if you would ike a stress test, Especially if breathing gets worse   Follow-Up: It was a pleasure seeing you in the office today. Please call us if you have new issues that need to be addressed before your next appt.  [phone]  Your physician wants you to follow-up in: 1 month.    If you need a refill on your cardiac medications before your next appointment, please call your pharmacy.  For educational health videos Log in to : [email or web address] Or : [email or web address], password : [email or web address]

## 2017-12-10 ENCOUNTER — Telehealth: Payer: Self-pay | Admitting: Cardiovascular Disease

## 2017-12-10 NOTE — Telephone Encounter (Signed)
Spoke with patient and he states that he was taking nitroglycerin for chest discomfort and fast heart rates. His blood pressures dropped really low when taking that nitro and advised that he should try to call us first before taking nitroglycerin and worst case go to local ED if chest pain with high heart rates. He is scheduled to come in and see [place] next week and confirmed that appointment with him. He verbalized understanding of our conversation, agreement with plan, and had no further questions at this time.

## 2017-12-10 NOTE — Telephone Encounter (Signed)
Pt c/o BP issue: STAT if pt c/o blurred vision, one-sided weakness or slurred speech  1. What are your last 5 BP readings?  Friday 10/18 - all readings about 15-20 minutes apart 104/94 pulse 156 Took nitro pill 50/36 pulse 74 68/55 pulse 140 Took another nitro pill 59/44 pulse 87 Threw up a clear liquid  128/78 pulse 90 Went to bed  Sat morning 10/19 140/83 pulse 72  2. Are you having any other symptoms (ex. Dizziness, headache, blurred vision, passed out)? Feels a little weak   3. What is your BP issue? Patient had angina pain when going to bed Friday night and had a bout with BP and high heart rate.  Patient states he has been feeling good ever since but wanted to mention to nurse.  Patient has an appointment scheduled for 10/28 with Dr. Rockey Situ.

## 2017-12-11 ENCOUNTER — Ambulatory Visit: Payer: Medicare Other | Admitting: Cardiovascular Disease

## 2017-12-11 ENCOUNTER — Encounter: Payer: Self-pay | Admitting: Cardiovascular Disease

## 2017-12-11 VITALS — BP 120/60 | HR 57 | Ht 70.0 in | Wt 178.5 lb

## 2017-12-11 DIAGNOSIS — E782 Mixed hyperlipidemia: Secondary | ICD-10-CM

## 2017-12-11 DIAGNOSIS — I471 Supraventricular tachycardia: Secondary | ICD-10-CM | POA: Diagnosis not present

## 2017-12-11 DIAGNOSIS — I255 Ischemic cardiomyopathy: Secondary | ICD-10-CM

## 2017-12-11 DIAGNOSIS — I951 Orthostatic hypotension: Secondary | ICD-10-CM | POA: Diagnosis not present

## 2017-12-11 DIAGNOSIS — R0602 Shortness of breath: Secondary | ICD-10-CM

## 2017-12-11 DIAGNOSIS — I25118 Atherosclerotic heart disease of native coronary artery with other forms of angina pectoris: Secondary | ICD-10-CM

## 2017-12-11 MED ORDER — EZETIMIBE 10 MG PO TABS: 10.0000 mg | ORAL_TABLET | Freq: Every day | ORAL | 3 refills | Status: DC

## 2017-12-11 MED ORDER — FLUDROCORTISONE ACETATE 0.1 MG PO TABS: 0.1000 mg | ORAL_TABLET | Freq: Every day | ORAL | 3 refills | Status: DC

## 2017-12-11 NOTE — Progress Notes (Signed)
Cardiology Office Note  Date:  12/11/2017   ID:  MARQUAL MI, DOB 07-Feb-1941, MRN [id number]  PCP:  Baxter Hire, MD   Chief Complaint  Patient presents with  . other    1 month f/u c/o rapid heart beat/vomiting and chest tightness/pain used nitro. Meds reviewed verbally with pt.    HPI:  Mr Geo Slone is a 77 y.o. male with  MI 62s CAD, CABG 1998, cardiac arrest following the procedure Ischemic cardiomyopathy ejection fraction 35 to 40% on echocardiogram 2019 Cath 11/2015 patent grafts x3 hyperlipidemia  SVT, adenosine 08/2017 Had SVT x 3 total (dec 2018, 08/2017 ( needed adenosine), 11/2017) Diabetes Presenting for follow-up of his coronary disease, shortness of breath  Chest pain on Sunday night,tachycardia, midnight  SVT?, pulse 150 bpm Took NTG x2 Rate 120s to 111 bpm Vomited , arrhythmia broke  Takes midodrine 8 AM, 2 pm Better, but still a little dizzy Able to walk without a cane  Chronic shortness of breath with overexertion More active than on his last clinic visit but still with shortness of breath limiting his activities  We did request the telemetry strips from July 2019 documenting narrow complex tachycardia rate 169 bpm that broke with adenosine  EKG personally reviewed by myself on todays visit Shows sinus bradycardia with APCs rate 57 bpm poor R wave progression to the anterior precordial leads, unable to exclude old inferior MI  Other past medical history reviewed  hospitalization  new onset of palpitations chest pain and neck fluttering 09/03/2017  suddenly when he was at home.  seen by the EMS.   Possible SVT,  given adenosine  resolved within 3 to 5 minutes.     echocardiogram July 2019  LV dysfunction of 35 to 40% cardiac catheterization which showed EF of 40% in 2017  Remote weight loss from pancreas Chronic dizziness, since that time Used to be on losartan, medication was weaned off but continues to have orthostasis that is  debilitating Feels it is affecting his life and making his balance worse  Prior cardiac catheterization results from 2017  Ramus lesion, 95 %stenosed.  Mid RCA lesion, 100 %stenosed.  SVG graft was visualized by angiography and is small.  Origin to Prox Graft lesion, 50 %stenosed.  SVG graft was visualized by angiography and is normal in caliber and anatomically normal.  Post Atrio lesion, 100 %stenosed.  Ost Cx to Mid Cx lesion, 40 %stenosed.  Dist RCA lesion, 100 %stenosed.  Mid LAD lesion, 85 %stenosed.  LM lesion, 30 %stenosed.  Ost 1st Mrg to 1st Mrg lesion, 65 %stenosed.  LIMA graft was visualized by angiography and is normal in caliber and anatomically normal.       PMH:   has a past medical history of Anginal pain ([place]), Coronary artery disease (1998), Depression, Diabetes mellitus without complication ([place]), Hyperlipidemia, Hypertension, Myocardial infarction ([place]), and SVT (supraventricular tachycardia) ([place]).  PSH:    Past Surgical History:  Procedure Laterality Date  . CARDIAC CATHETERIZATION Left 12/07/2015   Procedure: Left Heart Cath and Cors/Grafts Angiography;  Surgeon: Corey Skains, MD;  Location: [facility];  Service: Cardiovascular;  Laterality: Left;  . CORONARY ARTERY BYPASS GRAFT  1998   [facility]    Current Outpatient Medications  Medication Sig Dispense Refill  . allopurinol (ZYLOPRIM) 100 MG tablet Take 100 mg by mouth daily.    Marland Kitchen aspirin EC 81 MG tablet Take 81 mg by mouth daily.    Marland Kitchen glipiZIDE (GLUCOTROL) 10  MG tablet Take 10 mg by mouth 2 (two) times daily before a meal.    . metFORMIN (GLUCOPHAGE) 1000 MG tablet Take 1,000 mg by mouth daily with breakfast.     . midodrine (PROAMATINE) 10 MG tablet Take 1 tablet (10 mg total) by mouth 3 (three) times daily as needed. 90 tablet 3  . omeprazole (PRILOSEC) 20 MG capsule Take 20 mg by mouth daily.    Marland Kitchen rOPINIRole (REQUIP) 0.25 MG tablet Take 1 tablet (0.25 mg total) by mouth at  bedtime. 30 tablet 1  . sertraline (ZOLOFT) 100 MG tablet Take 100 mg by mouth daily.    . simvastatin (ZOCOR) 40 MG tablet Take 40 mg by mouth every evening.     No current facility-administered medications for this visit.      Allergies:   Ace inhibitors   Social History:  The patient  reports that he quit smoking about 39 years ago. His smoking use included cigarettes. He has a 37.50 pack-year smoking history. He has never used smokeless tobacco. He reports that he does not drink alcohol or use drugs.   Family History:   family history is not on file.    Review of Systems: Review of Systems  Constitutional: Negative.   Respiratory: Positive for shortness of breath.   Cardiovascular: Negative.   Gastrointestinal: Negative.   Musculoskeletal: Negative.   Neurological: Positive for dizziness.  Psychiatric/Behavioral: Negative.   All other systems reviewed and are negative.   PHYSICAL EXAM: VS:  BP 120/60 (BP Location: Left Arm, Patient Position: Sitting, Cuff Size: Normal)   Pulse (!) 57   Ht 5\' 10"  (1.778 m)   Wt 178 lb 8 oz (81 kg)   BMI 25.61 kg/m  , BMI Body mass index is 25.61 kg/m. Constitutional:  oriented to person, place, and time. No distress.  HENT:  Head: Normocephalic and atraumatic.  Eyes:  no discharge. No scleral icterus.  Neck: Normal range of motion. Neck supple. No JVD present.  Cardiovascular: Normal rate, regular rhythm, normal heart sounds and intact distal pulses. Exam reveals no gallop and no friction rub. No edema No murmur heard. Pulmonary/Chest: Effort normal and breath sounds normal. No stridor. No respiratory distress.  no wheezes.  no rales.  no tenderness.  Abdominal: Soft.  no distension.  no tenderness.  Musculoskeletal: Normal range of motion.  no  tenderness or deformity.  Neurological:  normal muscle tone. Coordination normal. No atrophy Skin: Skin is warm and dry. No rash noted. not diaphoretic.  Psychiatric:  normal mood and affect.  behavior is normal. Thought content normal.    Recent Labs: 09/03/2017: Hemoglobin 13.1; Platelets 181; TSH 2.490 09/04/2017: BUN 18; Creatinine, Ser 1.21; Potassium 5.0; Sodium 138    Lipid Panel Lab Results  Component Value Date   CHOL 158 04/25/2012   HDL 77 (H) 04/25/2012   LDLCALC 62 04/25/2012   TRIG 97 04/25/2012      Wt Readings from Last 3 Encounters:  12/11/17 178 lb 8 oz (81 kg)  11/02/17 179 lb 8 oz (81.4 kg)  09/04/17 178 lb 12.8 oz (81.1 kg)       ASSESSMENT AND PLAN:  Coronary artery disease of native artery of native heart with stable angina pectoris (HCC) - Plan: EKG 12-Lead Recent anginal symptoms in the setting of tachycardia Likely demand ischemia Not having chest pain at rest on a routine basis We did discuss stress testing again, he has deferred Recommend he call us if he has any anginal  symptoms on routine activities  SVT (supraventricular tachycardia) ([place]) - Plan: EKG 12-Lead Telemetry strips were requested and obtained showing narrow complex tachycardia rate 169 bpm 3 episodes total, one recently causing chest pain Discussed with him whether he would like referral to EP Heart rate running low limiting options for antiarrhythmic therapy or beta-blockers Ablation candidate?  Shortness of breath Deconditioned, no regular exercise program Though unable to exclude ischemia On last clinic visit and again today has declined stress testing Clearly had anginal symptoms in the setting of tachycardia rate in the 150 range  Orthostasis Tolerating midodrine 10 mg twice a day 8 AM 2 PM We will add fludrocortisone daily He continues to have orthostasis symptoms  Mixed hyperlipidemia Continue statin  recommended he add Zetia to reach goal  Ischemic cardiomyopathy He is orthostatic Limited options for medications   Disposition:   F/U  6 months Long discussion with him concerning above  Total encounter time more than 45 minutes  Greater than 50%  was spent in counseling and coordination of care with the patient    No orders of the defined types were placed in this encounter.    Signed, Esmond Plants, M.D., Ph.D. 12/11/2017  [place], [place]

## 2017-12-11 NOTE — Patient Instructions (Addendum)
[place] EMT[place] rhythm [place] showing SVT in 08/2017  Supraventricular tachycardia (SVT) For epsiodes of tachycardia, Don't forget: Valsalva , carotid massage   For chest pains without tachycardia Call the office We would order a stress test  Medication Instructions:   Please start florinef for dizziness, low pressures  Check price of midodrine with [facility]  Start zetia one a day for cholesterol  Labwork:  No new labs needed  Testing/Procedures:  No further testing at this time   Follow-Up: It was a pleasure seeing you in the office today. Please call us if you have new issues that need to be addressed before your next appt.  [phone]  Your physician wants you to follow-up in: 6 months.  You will receive a reminder letter in the mail two months in advance. If you don't receive a letter, please call our office to schedule the follow-up appointment.  If you need a refill on your cardiac medications before your next appointment, please call your pharmacy.  For educational health videos Log in to : [email or web address] Or : [email or web address], password : [email or web address]

## 2017-12-12 ENCOUNTER — Telehealth: Payer: Self-pay | Admitting: Cardiovascular Disease

## 2017-12-12 ENCOUNTER — Telehealth: Payer: Self-pay

## 2017-12-12 MED ORDER — MIDODRINE HCL 10 MG PO TABS: 10.0000 mg | ORAL_TABLET | Freq: Three times a day (TID) | ORAL | 3 refills | Status: DC | PRN

## 2017-12-12 MED ORDER — EZETIMIBE 10 MG PO TABS: 10.0000 mg | ORAL_TABLET | Freq: Every day | ORAL | 3 refills | Status: DC

## 2017-12-12 MED ORDER — FLUDROCORTISONE ACETATE 0.1 MG PO TABS: 0.1000 mg | ORAL_TABLET | Freq: Every day | ORAL | 1 refills | Status: DC

## 2017-12-12 NOTE — Telephone Encounter (Signed)
Patient calling stating his [facility] told him they do not have Zetia so he asked [facility] to take it over, [facility] is able to send it to patient.    But now he's been told the Florinef is not at [facility] and is calling to see if we can send this to [facility] as well   Please advise

## 2017-12-12 NOTE — Telephone Encounter (Signed)
Refill Zetia sent to [facility].

## 2017-12-12 NOTE — Telephone Encounter (Signed)
Florinef 0.1 mg daily has been sent to [facility] per the patient request. He has verbalized his understanding.

## 2017-12-14 ENCOUNTER — Other Ambulatory Visit: Payer: Self-pay

## 2017-12-14 MED ORDER — EZETIMIBE 10 MG PO TABS: 10.0000 mg | ORAL_TABLET | Freq: Every day | ORAL | 3 refills | Status: DC

## 2017-12-14 NOTE — Telephone Encounter (Signed)
Refill sent for Zetia.  

## 2017-12-17 ENCOUNTER — Ambulatory Visit: Payer: Medicare Other | Admitting: Cardiovascular Disease

## 2018-01-25 ENCOUNTER — Emergency Department: Payer: Medicare Other

## 2018-01-25 ENCOUNTER — Encounter: Payer: Self-pay | Admitting: Emergency Medicine

## 2018-01-25 ENCOUNTER — Emergency Department
Admission: EM | Admit: 2018-01-25 | Discharge: 2018-01-25 | Disposition: A | Discharge status: Home / Self Care | Payer: Medicare Other | Attending: Emergency Medicine | Admitting: Emergency Medicine

## 2018-01-25 DIAGNOSIS — Z79899 Other long term (current) drug therapy: Secondary | ICD-10-CM | POA: Insufficient documentation

## 2018-01-25 DIAGNOSIS — Z7984 Long term (current) use of oral hypoglycemic drugs: Secondary | ICD-10-CM | POA: Insufficient documentation

## 2018-01-25 DIAGNOSIS — I471 Supraventricular tachycardia: Secondary | ICD-10-CM | POA: Insufficient documentation

## 2018-01-25 DIAGNOSIS — I252 Old myocardial infarction: Secondary | ICD-10-CM | POA: Insufficient documentation

## 2018-01-25 DIAGNOSIS — Z951 Presence of aortocoronary bypass graft: Secondary | ICD-10-CM | POA: Diagnosis not present

## 2018-01-25 DIAGNOSIS — Z7982 Long term (current) use of aspirin: Secondary | ICD-10-CM | POA: Insufficient documentation

## 2018-01-25 DIAGNOSIS — I25119 Atherosclerotic heart disease of native coronary artery with unspecified angina pectoris: Secondary | ICD-10-CM | POA: Insufficient documentation

## 2018-01-25 DIAGNOSIS — R079 Chest pain, unspecified: Secondary | ICD-10-CM | POA: Diagnosis present

## 2018-01-25 DIAGNOSIS — E119 Type 2 diabetes mellitus without complications: Secondary | ICD-10-CM | POA: Insufficient documentation

## 2018-01-25 DIAGNOSIS — I10 Essential (primary) hypertension: Secondary | ICD-10-CM | POA: Diagnosis not present

## 2018-01-25 DIAGNOSIS — Z87891 Personal history of nicotine dependence: Secondary | ICD-10-CM | POA: Insufficient documentation

## 2018-01-25 LAB — BASIC METABOLIC PANEL
ANION GAP: 7 (ref 5–15)
BUN: 15 mg/dL (ref 8–23)
CO2: 24 mmol/L (ref 22–32)
Calcium: 9 mg/dL (ref 8.9–10.3)
Chloride: 109 mmol/L (ref 98–111)
Creatinine, Ser: 1.07 mg/dL (ref 0.61–1.24)
GFR calc Af Amer: >60 mL/min (ref >60)
GFR calc non Af Amer: >60 mL/min (ref >60)
Glucose, Bld: 163 mg/dL — ABNORMAL HIGH (ref 70–99)
POTASSIUM: 3.7 mmol/L (ref 3.5–5.1)
Sodium: 140 mmol/L (ref 135–145)

## 2018-01-25 LAB — TROPONIN I

## 2018-01-25 LAB — CBC
HCT: 38.8 % — ABNORMAL LOW (ref 39.0–52.0)
HEMOGLOBIN: 13.3 g/dL (ref 13.0–17.0)
MCH: 32.6 pg (ref 26.0–34.0)
MCHC: 34.3 g/dL (ref 30.0–36.0)
MCV: 95.1 fL (ref 80.0–100.0)
NRBC: 0 % (ref 0.0–0.2)
Platelets: 190 10*3/uL (ref 150–400)
RBC: 4.08 MIL/uL — AB (ref 4.22–5.81)
RDW: 11.4 % — ABNORMAL LOW (ref 11.5–15.5)
WBC: 8.3 10*3/uL (ref 4.0–10.5)

## 2018-01-25 NOTE — ED Provider Notes (Signed)
[facility] Emergency Department Provider Note    ____________________________________________   I have reviewed the triage vital signs and the nursing notes.   HISTORY  Chief Complaint Chest Pain and SVT   History limited by: Not Limited   HPI Johnny [name] is a 77 y.o. male who presents to the emergency department today because of concerns for chest pain and fast heartbeat.  Patient states that he developed the chest pain earlier this afternoon.  He states that he had had a normal morning.  Chest pain was located centrally.  He described it as sharp with some radiation up to the jaw.  He also felt like his heart was racing very fast at that time.  He took his pulse at home and found it to be in the 160s.  He did try vagal maneuvers without success.  When EMS arrived he was found to be in SVT.  They gave the patient 6 of adenosine which broke the SVT.  The patient states that after his rhythm came back to normal he felt much improved.  He denies any current pain.  Per medical record review patient has a history of SVT  Past Medical History:  Diagnosis Date  . Anginal pain ([place])   . Coronary artery disease 1998   CABG x 3  . Depression   . Diabetes mellitus without complication ([place])   . Hyperlipidemia   . Hypertension   . Myocardial infarction ([place])   . SVT (supraventricular tachycardia) [facility])     Patient Active Problem List   Diagnosis Date Noted  . Shortness of breath 11/02/2017  . Orthostasis 11/02/2017  . Mixed hyperlipidemia 11/02/2017  . Ischemic cardiomyopathy 11/02/2017  . SVT (supraventricular tachycardia) ([place]) 10/30/2017  . CAD (coronary artery disease), native coronary artery 10/30/2017  . Chest pain 09/03/2017    Past Surgical History:  Procedure Laterality Date  . CARDIAC CATHETERIZATION Left 12/07/2015   Procedure: Left Heart Cath and Cors/Grafts Angiography;  Surgeon: Corey Skains, MD;  Location: [place] [facility];   Service: Cardiovascular;  Laterality: Left;  . CORONARY ARTERY BYPASS GRAFT  1998   [facility]    Prior to Admission medications   Medication Sig Start Date End Date Taking? Authorizing Provider  allopurinol (ZYLOPRIM) 100 MG tablet Take 100 mg by mouth daily.    [provider]  aspirin EC 81 MG tablet Take 81 mg by mouth daily.    [provider]  ezetimibe (ZETIA) 10 MG tablet Take 1 tablet (10 mg total) by mouth daily. 12/14/17   Minna Merritts, MD  fludrocortisone (FLORINEF) 0.1 MG tablet Take 1 tablet (0.1 mg total) by mouth daily. 12/12/17   Minna Merritts, MD  glipiZIDE (GLUCOTROL) 10 MG tablet Take 10 mg by mouth 2 (two) times daily before a meal.    [provider]  metFORMIN (GLUCOPHAGE) 1000 MG tablet Take 1,000 mg by mouth daily with breakfast.     [provider]  midodrine (PROAMATINE) 10 MG tablet Take 1 tablet (10 mg total) by mouth 3 (three) times daily as needed. 12/12/17   Minna Merritts, MD  omeprazole (PRILOSEC) 20 MG capsule Take 20 mg by mouth daily.    [provider]  rOPINIRole (REQUIP) 0.25 MG tablet Take 1 tablet (0.25 mg total) by mouth at bedtime. 09/04/17 12/11/17  [name], MD  sertraline (ZOLOFT) 100 MG tablet Take 100 mg by mouth daily. 11/01/17   [provider]  simvastatin (  ZOCOR) 40 MG tablet Take 40 mg by mouth every evening.    [provider]    Allergies Ace inhibitors  History reviewed. No pertinent family history.  Social History Social History   Tobacco Use  . Smoking status: Former Smoker    Packs/day: 1.50    Years: 25.00    Pack years: 37.50    Types: Cigarettes    Last attempt to quit: 03/24/1978    Years since quitting: 39.8  . Smokeless tobacco: Never Used  Substance Use Topics  . Alcohol use: No  . Drug use: No    Review of Systems Constitutional: No fever/chills Eyes: No visual changes. ENT: No sore throat. Cardiovascular: Positive for chest pain,  fast heart rate.  Respiratory: Denies shortness of breath. Gastrointestinal: No abdominal pain.  No nausea, no vomiting.  No diarrhea.   Genitourinary: Negative for dysuria. Musculoskeletal: Negative for back pain. Skin: Negative for rash. Neurological: Negative for headaches, focal weakness or numbness.  ____________________________________________   PHYSICAL EXAM:  VITAL SIGNS: ED Triage Vitals [01/25/18 1357]  Enc Vitals Group     BP (!) 145/87     Pulse Rate 85     Resp 18     Temp 97.9 F (36.6 C)     Temp Source Oral     SpO2 99 %     Weight 175 lb (79.4 kg)     Height 5\' 10"  (1.778 m)     Head Circumference      Peak Flow      Pain Score 0   Constitutional: Alert and oriented.  Eyes: Conjunctivae are normal.  ENT      Head: Normocephalic and atraumatic.      Nose: No congestion/rhinnorhea.      Mouth/Throat: Mucous membranes are moist.      Neck: No stridor. Hematological/Lymphatic/Immunilogical: No cervical lymphadenopathy. Cardiovascular: Normal rate, regular rhythm.  No murmurs, rubs, or gallops.  Respiratory: Normal respiratory effort without tachypnea nor retractions. Breath sounds are clear and equal bilaterally. No wheezes/rales/rhonchi. Gastrointestinal: Soft and non tender. No rebound. No guarding.  Genitourinary: Deferred Musculoskeletal: Normal range of motion in all extremities. No lower extremity edema. Neurologic:  Normal speech and language. No gross focal neurologic deficits are appreciated.  Skin:  Skin is warm, dry and intact. No rash noted. Psychiatric: Mood and affect are normal. Speech and behavior are normal. Patient exhibits appropriate insight and judgment.  ____________________________________________    LABS (pertinent positives/negatives)  CBC wbc 8.3, hgb 13.3, plt 190 BMP wnl except glu 163 Trop <0.03 ____________________________________________   EKG  I, Nance Pear, attending physician, personally viewed and  interpreted this EKG  EKG Time: 1356 Rate: 88 Rhythm: sinus rhythm Axis: left axis deviation Intervals: qtc 464 QRS: narrow, q waves v1, v2, v3, III ST changes: no st elevation Impression: abnormal ekg   ____________________________________________    RADIOLOGY  CXR No acute process identified  ____________________________________________   PROCEDURES  Procedures  ____________________________________________   INITIAL IMPRESSION / ASSESSMENT AND PLAN / ED COURSE  Pertinent labs & imaging results that were available during my care of the patient were reviewed by me and considered in my medical decision making (see chart for details).   Presented to the emergency department today after an episode of SVT which broke with adenosine given by EMS.  By the time my exam patient states he is feeling much better.  Discussed with Dr. Rockey Situ who will help set up point with EP. Discussed plan with patient.  ____________________________________________   FINAL CLINICAL IMPRESSION(S) / ED DIAGNOSES  Final diagnoses:  SVT (supraventricular tachycardia) ([place])     Note: This dictation was prepared with Dragon dictation. Any transcriptional errors that result from this process are unintentional     Nance Pear, MD 01/25/18 1601

## 2018-01-25 NOTE — ED Notes (Signed)
Pt verbalized understanding of d/c instructions and f/u care. No further questions at this time. IV catheter removed,VSS at d/c. Pt assisted to exit via wheelchair.

## 2018-01-25 NOTE — ED Triage Notes (Signed)
Pt to ED by EMS with c/o of chest pain that started about 2 hours ago. Pt has hx of SVT and upon EMS arrival pt had HR in the 160s. Pt given 6 mg of Adenosine with relief.

## 2018-01-25 NOTE — ED Notes (Signed)
Hourly rounding completed. Pt resting comfortably, remains on cardiac monitor, call bell within reach, POC reviewed, no needs expressed at this time.

## 2018-01-25 NOTE — Discharge Instructions (Addendum)
Please seek medical attention for any high fevers, chest pain, shortness of breath, change in behavior, persistent vomiting, bloody stool or any other new or concerning symptoms.  

## 2018-01-28 ENCOUNTER — Telehealth: Payer: Self-pay | Admitting: Cardiovascular Disease

## 2018-01-28 NOTE — Telephone Encounter (Signed)
Patient calling back. Spoke with Ace Gins who had message from Dr Rockey Situ to schedule patient to see EP as soon as able. Next available is with Dr Curt Bears on 12/31. Patient is agreeable and verbalized understanding that this is for evaluation and more appropriate than seeing Dr Rockey Situ at this time. Patient states he is not having any symptoms at this time.

## 2018-01-28 NOTE — Telephone Encounter (Signed)
Patient daughter in law Butch Penny calling States that patient was seen in ER on Friday for SVT States that after being released was told to see Dr. Rockey Situ on Monday Patient daughter in law declines appointment with assistants and declines Dr. Donivan Scull next available on 12/17, wants sooner  Please call to discuss at [phone]

## 2018-02-05 ENCOUNTER — Encounter: Payer: Self-pay | Admitting: *Deleted

## 2018-02-18 ENCOUNTER — Encounter: Payer: Self-pay | Admitting: *Deleted

## 2018-02-19 ENCOUNTER — Encounter: Payer: Self-pay | Admitting: *Deleted

## 2018-02-19 ENCOUNTER — Encounter: Payer: Self-pay | Admitting: Cardiology

## 2018-02-19 ENCOUNTER — Ambulatory Visit: Payer: Medicare Other | Admitting: Cardiology

## 2018-02-19 VITALS — BP 126/64 | HR 81 | Ht 70.0 in | Wt 173.0 lb

## 2018-02-19 DIAGNOSIS — I471 Supraventricular tachycardia: Secondary | ICD-10-CM | POA: Diagnosis not present

## 2018-02-19 DIAGNOSIS — I255 Ischemic cardiomyopathy: Secondary | ICD-10-CM | POA: Diagnosis not present

## 2018-02-19 DIAGNOSIS — I25708 Atherosclerosis of coronary artery bypass graft(s), unspecified, with other forms of angina pectoris: Secondary | ICD-10-CM | POA: Diagnosis not present

## 2018-02-19 NOTE — Patient Instructions (Signed)
Medication Instructions:  Your physician recommends that you continue on your current medications as directed. Please refer to the Current Medication list given to you today.  * If you need a refill on your cardiac medications before your next appointment, please call your pharmacy.   Labwork: None ordered  Testing/Procedures: Your physician has recommended that you have an ablation. Catheter ablation is a medical procedure used to treat some cardiac arrhythmias (irregular heartbeats). During catheter ablation, a long, thin, flexible tube is put into a blood vessel in your groin (upper thigh), or neck. This tube is called an ablation catheter. It is then guided to your heart through the blood vessel. Radio frequency waves destroy small areas of heart tissue where abnormal heartbeats may cause an arrhythmia to start.   The nurse will call you to schedule this procedure.  Follow-Up: To be determined once procedure is scheduled.  Thank you for choosing [facility]!!   Trinidad Curet, RN [phone]  Any Other Special Instructions Will Be Listed Below (If Applicable).

## 2018-02-19 NOTE — Progress Notes (Signed)
Electrophysiology Office Note   Date:  02/19/2018   ID:  Johnny [name], DOB [birth year], MRN [id number]  PCP:  Johnny Hire, MD  Cardiologist:  Johnny [name] Primary Electrophysiologist:  Johnny Lukes Meredith Leeds, MD    No chief complaint on file.    History of Present Illness: Johnny [name] is a 77 y.o. male who is being seen today for the evaluation of SVT at the request of Johnny Hire, MD. Presenting today for electrophysiology evaluation.  He has a history of coronary disease status post CABG in 1998 with cardiac arrest following the procedure, MI in the 1980s, ischemic cardiomyopathy, hyperlipidemia, SVT.  He presented the emergency room 01/25/2018 with palpitations.  This was associated with chest pain with radiation to the jaw.  His heart rate was in the 160s.  He did vagal maneuvers without success.  He was given 6 mg of adenosine which terminated his SVT.  He felt much improved after resolution of SVT.  Today, he denies symptoms of palpitations, chest pain, shortness of breath, orthopnea, PND, lower extremity edema, claudication, dizziness, presyncope, syncope, bleeding, or neurologic sequela. The patient is tolerating medications without difficulties.    Past Medical History:  Diagnosis Date  . Anginal pain ([place])   . Coronary artery disease 1998   CABG x 3  . Depression   . Diabetes mellitus without complication ([place])   . Hyperlipidemia   . Hypertension   . Myocardial infarction ([place])   . SVT (supraventricular tachycardia) (HCC)    Past Surgical History:  Procedure Laterality Date  . CARDIAC CATHETERIZATION Left 12/07/2015   Procedure: Left Heart Cath and Cors/Grafts Angiography;  Surgeon: Corey Skains, MD;  Location: [facility];  Service: Cardiovascular;  Laterality: Left;  . CORONARY ARTERY BYPASS GRAFT  1998   [facility]     Current Outpatient Medications  Medication Sig Dispense Refill  . allopurinol (ZYLOPRIM) 100 MG tablet Take 100 mg by mouth daily.     Marland Kitchen aspirin EC 81 MG tablet Take 81 mg by mouth daily.    Marland Kitchen ezetimibe (ZETIA) 10 MG tablet Take 1 tablet (10 mg total) by mouth daily. 90 tablet 3  . fludrocortisone (FLORINEF) 0.1 MG tablet Take 1 tablet (0.1 mg total) by mouth daily. 90 tablet 1  . glipiZIDE (GLUCOTROL) 10 MG tablet Take 10 mg by mouth 2 (two) times daily before a meal.    . metFORMIN (GLUCOPHAGE) 1000 MG tablet Take 1,000 mg by mouth 2 (two) times daily with a meal.     . midodrine (PROAMATINE) 10 MG tablet Take 1 tablet (10 mg total) by mouth 3 (three) times daily as needed. (Patient taking differently: Take 10 mg by mouth 3 (three) times daily. ) 90 tablet 3  . omeprazole (PRILOSEC) 20 MG capsule Take 20 mg by mouth daily.    . sertraline (ZOLOFT) 100 MG tablet Take 100 mg by mouth daily.    . simvastatin (ZOCOR) 40 MG tablet Take 40 mg by mouth every evening.    Marland Kitchen rOPINIRole (REQUIP) 0.25 MG tablet Take 1 tablet (0.25 mg total) by mouth at bedtime. 30 tablet 1   No current facility-administered medications for this visit.     Allergies:   Ace inhibitors   Social History:  The patient  reports that he quit smoking about 39 years ago. His smoking use included cigarettes. He has a 37.50 pack-year smoking history. He has never used smokeless tobacco. He reports that he does not drink alcohol  or use drugs.   Family History:  The patient's family history includes Heart attack in his brother, brother, father, sister, sister, and sister.    ROS:  Please see the history of present illness.   Otherwise, review of systems is positive for chest pain, shortness of breath, chest pressure, nausea, back pain, balance problems, dizziness.   All other systems are reviewed and negative.    PHYSICAL EXAM: VS:  BP 126/64   Pulse 81   Ht 5\' 10"  (1.778 m)   Wt 173 lb (78.5 kg)   BMI 24.82 kg/m  , BMI Body mass index is 24.82 kg/m. GEN: Well nourished, well developed, in no acute distress  HEENT: normal  Neck: no JVD, carotid  bruits, or masses Cardiac: RRR; no murmurs, rubs, or gallops,no edema  Respiratory:  clear to auscultation bilaterally, normal work of breathing GI: soft, nontender, nondistended, + BS MS: no deformity or atrophy  Skin: warm and dry Neuro:  Strength and sensation are intact Psych: euthymic mood, full affect  EKG:  EKG is ordered today. Personal review of the ekg ordered shows sinus rhythm, septal and lateral Q waves, rate 81  Recent Labs: 09/03/2017: TSH 2.490 01/25/2018: BUN 15; Creatinine, Ser 1.07; Hemoglobin 13.3; Platelets 190; Potassium 3.7; Sodium 140    Lipid Panel     Component Value Date/Time   CHOL 158 04/25/2012 1011   TRIG 97 04/25/2012 1011   HDL 77 (H) 04/25/2012 1011   VLDL 19 04/25/2012 1011   LDLCALC 62 04/25/2012 1011     Wt Readings from Last 3 Encounters:  02/19/18 173 lb (78.5 kg)  01/25/18 175 lb (79.4 kg)  12/11/17 178 lb 8 oz (81 kg)      Other studies Reviewed: Additional studies/ records that were reviewed today include: TTE 09/04/17  Review of the above records today demonstrates:  - Left ventricle: The cavity size was mildly dilated. Systolic   function was moderately reduced. The estimated ejection fraction   was in the range of 35% to 40%. Hypokinesis of the inferior   myocardium. Hypokinesis of the inferoseptal myocardium.   Hypokinesis of the apical myocardium. - Aortic valve: There was trivial regurgitation. Valve area (VTI):   1.75 cm^2. Valve area (Vmax): 1.62 cm^2. Valve area (Vmean): 1.49   cm^2. - Mitral valve: There was mild regurgitation. - Left atrium: The atrium was mildly dilated.   ASSESSMENT AND PLAN:  1.  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  I discussed with him options, but they are limited due to hypotension.  He is on Midrin.  Due to that, I have offered him ablation.  Risks and benefits include bleeding, tamponade, heart block, stroke, among others.  He understands these risks and is agreed to the procedure.  2.   Coronary artery disease status post CABG: Currently chest pain-free.  He does have pain during tachycardia.  No changes.  3.  Ischemic cardiomyopathy: No current chest pain.  Continue with current management.  Current medicines are reviewed at length with the patient today.   The patient does not have concerns regarding his medicines.  The following changes were made today:  none  Labs/ tests ordered today include:  Orders Placed This Encounter  Procedures  . EKG 12-Lead   Case discussed with primary cardiology  Disposition:   FU with Wei Newbrough 2 months  Signed, Arilla Hice Meredith Leeds, MD  02/19/2018 3:44 PM     [facility] [facility] [address] [place]  [address] [phone] (office) [phone] (fax)

## 2018-02-21 ENCOUNTER — Other Ambulatory Visit: Payer: Self-pay

## 2018-02-21 MED ORDER — EZETIMIBE 10 MG PO TABS: 10.0000 mg | ORAL_TABLET | Freq: Every day | ORAL | 0 refills | Status: DC

## 2018-02-21 MED ORDER — ASPIRIN EC 81 MG PO TBEC: 81.0000 mg | DELAYED_RELEASE_TABLET | Freq: Every day | ORAL | 0 refills | Status: AC

## 2018-02-21 NOTE — Telephone Encounter (Signed)
*  STAT* If patient is at the pharmacy, call can be transferred to refill team.   1. Which medications need to be refilled? (please list name of each medication and dose if known) Zetia  2. Which pharmacy/location (including street and city if local pharmacy) is medication to be sent to? [facility]  3. Do they need a 30 day or 90 day supply? [place]

## 2018-03-04 ENCOUNTER — Telehealth: Payer: Self-pay | Admitting: *Deleted

## 2018-03-04 DIAGNOSIS — Z01812 Encounter for preprocedural laboratory examination: Secondary | ICD-10-CM

## 2018-03-04 DIAGNOSIS — I471 Supraventricular tachycardia: Secondary | ICD-10-CM

## 2018-03-04 NOTE — Telephone Encounter (Signed)
-----   Message from Stanton Kidney, RN sent at 03/01/2018  5:32 PM EST ----- Regarding: labs  Pt is scheduled for SVT ablation 1/22 w/ [facility] He needs to stop by office week of 1/13 - 1/17 for pre procedure labs (BMET/CBC) Can you please call patient and arrange?   (greatly appreciate it)  Let me know if I need to do anything. Trinidad Curet, RN

## 2018-03-04 NOTE — Telephone Encounter (Signed)
I spoke with the patient. He will come to the [place] at [facility]- 1st desk on the right- to have his BMP/ CBC done as a walk in lab.  The patient is aware that he does not have to be fasting. He states he will have this done tomorrow. Orders entered.

## 2018-03-05 ENCOUNTER — Other Ambulatory Visit
Admission: RE | Admit: 2018-03-05 | Discharge: 2018-03-05 | Disposition: A | Discharge status: Home / Self Care | Payer: Medicare Other | Source: Ambulatory Visit | Attending: Cardiology | Admitting: Cardiology

## 2018-03-05 DIAGNOSIS — I471 Supraventricular tachycardia: Secondary | ICD-10-CM

## 2018-03-05 DIAGNOSIS — Z01812 Encounter for preprocedural laboratory examination: Secondary | ICD-10-CM | POA: Diagnosis present

## 2018-03-05 LAB — CBC WITH DIFFERENTIAL/PLATELET
Abs Immature Granulocytes: 0.02 10*3/uL (ref 0.00–0.07)
Basophils Absolute: 0.1 10*3/uL (ref 0.0–0.1)
Basophils Relative: 1 %
Eosinophils Absolute: 0.2 10*3/uL (ref 0.0–0.5)
Eosinophils Relative: 3 %
HCT: 40.2 % (ref 39.0–52.0)
Hemoglobin: 13.8 g/dL (ref 13.0–17.0)
Immature Granulocytes: 0 %
Lymphocytes Relative: 23 %
Lymphs Abs: 1.7 10*3/uL (ref 0.7–4.0)
MCH: 32.4 pg (ref 26.0–34.0)
MCHC: 34.3 g/dL (ref 30.0–36.0)
MCV: 94.4 fL (ref 80.0–100.0)
Monocytes Absolute: 0.6 10*3/uL (ref 0.1–1.0)
Monocytes Relative: 9 %
NEUTROS ABS: 4.8 10*3/uL (ref 1.7–7.7)
Neutrophils Relative %: 64 %
Platelets: 230 10*3/uL (ref 150–400)
RBC: 4.26 MIL/uL (ref 4.22–5.81)
RDW: 11.6 % (ref 11.5–15.5)
WBC: 7.4 10*3/uL (ref 4.0–10.5)
nRBC: 0 % (ref 0.0–0.2)

## 2018-03-05 LAB — BASIC METABOLIC PANEL
Anion gap: 10 (ref 5–15)
BUN: 18 mg/dL (ref 8–23)
CO2: 23 mmol/L (ref 22–32)
Calcium: 9.2 mg/dL (ref 8.9–10.3)
Chloride: 104 mmol/L (ref 98–111)
Creatinine, Ser: 1.11 mg/dL (ref 0.61–1.24)
GFR calc Af Amer: >60 mL/min (ref >60)
GFR calc non Af Amer: >60 mL/min (ref >60)
Glucose, Bld: 232 mg/dL — ABNORMAL HIGH (ref 70–99)
Potassium: 3.9 mmol/L (ref 3.5–5.1)
SODIUM: 137 mmol/L (ref 135–145)

## 2018-03-06 ENCOUNTER — Emergency Department
Admission: EM | Admit: 2018-03-06 | Discharge: 2018-03-06 | Disposition: A | Discharge status: Home / Self Care | Payer: Medicare Other | Attending: Student in an Organized Health Care Education/Training Program | Admitting: Student in an Organized Health Care Education/Training Program

## 2018-03-06 ENCOUNTER — Other Ambulatory Visit: Payer: Self-pay

## 2018-03-06 ENCOUNTER — Telehealth: Payer: Self-pay | Admitting: *Deleted

## 2018-03-06 DIAGNOSIS — I471 Supraventricular tachycardia: Secondary | ICD-10-CM | POA: Diagnosis not present

## 2018-03-06 DIAGNOSIS — I10 Essential (primary) hypertension: Secondary | ICD-10-CM | POA: Diagnosis not present

## 2018-03-06 DIAGNOSIS — Z7984 Long term (current) use of oral hypoglycemic drugs: Secondary | ICD-10-CM | POA: Insufficient documentation

## 2018-03-06 DIAGNOSIS — I2510 Atherosclerotic heart disease of native coronary artery without angina pectoris: Secondary | ICD-10-CM | POA: Diagnosis not present

## 2018-03-06 DIAGNOSIS — Z7982 Long term (current) use of aspirin: Secondary | ICD-10-CM | POA: Diagnosis not present

## 2018-03-06 DIAGNOSIS — Z79899 Other long term (current) drug therapy: Secondary | ICD-10-CM | POA: Diagnosis not present

## 2018-03-06 DIAGNOSIS — I252 Old myocardial infarction: Secondary | ICD-10-CM | POA: Insufficient documentation

## 2018-03-06 DIAGNOSIS — E119 Type 2 diabetes mellitus without complications: Secondary | ICD-10-CM | POA: Diagnosis not present

## 2018-03-06 DIAGNOSIS — Z951 Presence of aortocoronary bypass graft: Secondary | ICD-10-CM | POA: Diagnosis not present

## 2018-03-06 DIAGNOSIS — R000 Tachycardia, unspecified: Secondary | ICD-10-CM | POA: Diagnosis present

## 2018-03-06 DIAGNOSIS — F329 Major depressive disorder, single episode, unspecified: Secondary | ICD-10-CM | POA: Insufficient documentation

## 2018-03-06 DIAGNOSIS — Z87891 Personal history of nicotine dependence: Secondary | ICD-10-CM | POA: Insufficient documentation

## 2018-03-06 LAB — CBC WITH DIFFERENTIAL/PLATELET
Abs Immature Granulocytes: 0.02 10*3/uL (ref 0.00–0.07)
Basophils Absolute: 0 10*3/uL (ref 0.0–0.1)
Basophils Relative: 0 %
Eosinophils Absolute: 0.3 10*3/uL (ref 0.0–0.5)
Eosinophils Relative: 4 %
HCT: 37 % — ABNORMAL LOW (ref 39.0–52.0)
Hemoglobin: 13 g/dL (ref 13.0–17.0)
Immature Granulocytes: 0 %
LYMPHS ABS: 2.3 10*3/uL (ref 0.7–4.0)
Lymphocytes Relative: 31 %
MCH: 32.4 pg (ref 26.0–34.0)
MCHC: 35.1 g/dL (ref 30.0–36.0)
MCV: 92.3 fL (ref 80.0–100.0)
MONOS PCT: 10 %
Monocytes Absolute: 0.7 10*3/uL (ref 0.1–1.0)
Neutro Abs: 3.9 10*3/uL (ref 1.7–7.7)
Neutrophils Relative %: 55 %
Platelets: 194 10*3/uL (ref 150–400)
RBC: 4.01 MIL/uL — ABNORMAL LOW (ref 4.22–5.81)
RDW: 11.3 % — ABNORMAL LOW (ref 11.5–15.5)
WBC: 7.2 10*3/uL (ref 4.0–10.5)
nRBC: 0 % (ref 0.0–0.2)

## 2018-03-06 LAB — COMPREHENSIVE METABOLIC PANEL
ALT: 14 U/L (ref 0–44)
AST: 21 U/L (ref 15–41)
Albumin: 4 g/dL (ref 3.5–5.0)
Alkaline Phosphatase: 105 U/L (ref 38–126)
Anion gap: 9 (ref 5–15)
BILIRUBIN TOTAL: 0.7 mg/dL (ref 0.3–1.2)
BUN: 16 mg/dL (ref 8–23)
CO2: 22 mmol/L (ref 22–32)
CREATININE: 0.94 mg/dL (ref 0.61–1.24)
Calcium: 8.8 mg/dL — ABNORMAL LOW (ref 8.9–10.3)
Chloride: 106 mmol/L (ref 98–111)
GFR calc Af Amer: >60 mL/min (ref >60)
GFR calc non Af Amer: >60 mL/min (ref >60)
Glucose, Bld: 180 mg/dL — ABNORMAL HIGH (ref 70–99)
Potassium: 3.6 mmol/L (ref 3.5–5.1)
Sodium: 137 mmol/L (ref 135–145)
Total Protein: 7 g/dL (ref 6.5–8.1)

## 2018-03-06 MED ORDER — METOPROLOL TARTRATE 25 MG PO TABS: 25.0000 mg | ORAL_TABLET | Freq: Two times a day (BID) | ORAL | 0 refills | Status: DC

## 2018-03-06 MED ORDER — METOPROLOL TARTRATE 25 MG PO TABS
25.0000 mg | ORAL_TABLET | Freq: Once | ORAL | Status: AC
  Administered 2018-03-06: 25 mg via ORAL
  Filled 2018-03-06: qty 1

## 2018-03-06 NOTE — ED Notes (Signed)
vss family at bedside. Denies pain/sob or concern. Awaiting further plan of care.

## 2018-03-06 NOTE — ED Triage Notes (Signed)
Report fast heart rate today, scheduled for Ablation today, appt was cancelled. Patient given adenosine 6mg  in route for rate 170's. Patient rate in 70 upon ed arrival. Denies sob/cp

## 2018-03-06 NOTE — ED Provider Notes (Signed)
[facility] Emergency Department Provider Note    First MD Initiated Contact with Patient 03/06/18 2016     (approximate)  I have reviewed the triage vital signs and the nursing notes.   HISTORY  Chief Complaint Tachycardia    HPI Johnny [name] is a 78 y.o. male history of SVT scheduled for outpatient ablation which was held today presents the ER for tachycardia and palpitations.  Patient found by EMS to be tachycardic to 170s.  Was given a single dose of adenosine with reversion to sinus rhythm.  Denies any pain at this time no shortness of breath.    Past Medical History:  Diagnosis Date  . Anginal pain ([place])   . Coronary artery disease 1998   CABG x 3  . Depression   . Diabetes mellitus without complication ([place])   . Hyperlipidemia   . Hypertension   . Myocardial infarction ([place])   . SVT (supraventricular tachycardia) (HCC)    Family History  Problem Relation Age of Onset  . Heart attack Father   . Heart attack Sister   . Heart attack Brother   . Heart attack Brother   . Heart attack Sister   . Heart attack Sister    Past Surgical History:  Procedure Laterality Date  . CARDIAC CATHETERIZATION Left 12/07/2015   Procedure: Left Heart Cath and Cors/Grafts Angiography;  Surgeon: Corey Skains, MD;  Location: [place] [facility];  Service: Cardiovascular;  Laterality: Left;  . CORONARY ARTERY BYPASS GRAFT  1998   [facility]   Patient Active Problem List   Diagnosis Date Noted  . Shortness of breath 11/02/2017  . Ischemic cardiomyopathy 11/02/2017  . Depression, prolonged 11/01/2017  . SVT (supraventricular tachycardia) ([place]) 10/30/2017  . CAD (coronary artery disease), native coronary artery 10/30/2017  . Chest pain 09/03/2017  . Dizziness 08/09/2017  . SOBOE (shortness of breath on exertion) 08/09/2017  . 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 Benign essential HTN 12/21/2015  . Myocardial infarction, inferior wall ([place])  12/21/2015  . CAD (coronary artery disease) 11/22/2015  . Type 2 diabetes mellitus with complication, without long-term current use of insulin ([place]) 03/11/2015  . 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 Anemia 06/10/2014  . Nephrolithiasis 03/17/2014  . Hyperlipidemia 09/10/2013  . Renal insufficiency 09/10/2013  . Psoriasis 09/10/2013  . Obesity 09/10/2013      Prior to Admission medications   Medication Sig Start Date End Date Taking? Authorizing Provider  allopurinol (ZYLOPRIM) 100 MG tablet Take 100 mg by mouth daily.   Yes [provider]  aspirin EC 81 MG tablet Take 1 tablet (81 mg total) by mouth daily. 02/21/18  Yes Minna Merritts, MD  ezetimibe (ZETIA) 10 MG tablet Take 1 tablet (10 mg total) by mouth daily. 02/21/18  Yes Gollan, Kathlene November, MD  fludrocortisone (FLORINEF) 0.1 MG tablet Take 1 tablet (0.1 mg total) by mouth daily. 12/12/17  Yes Gollan, Kathlene November, MD  glipiZIDE (GLUCOTROL) 10 MG tablet Take 10 mg by mouth 2 (two) times daily before a meal.   Yes [provider]  metFORMIN (GLUCOPHAGE) 1000 MG tablet Take 1,000 mg by mouth 2 (two) times daily with a meal.    Yes [provider]  midodrine (PROAMATINE) 10 MG tablet Take 1 tablet (10 mg total) by mouth 3 (three) times daily as needed. Patient taking differently: Take 10 mg by mouth 3 (three) times daily.  12/12/17  Yes Minna Merritts, MD  omeprazole (PRILOSEC) 20 MG capsule Take 20 mg by mouth daily.   Yes [provider]  rOPINIRole (REQUIP) 0.25 MG tablet Take 1 tablet (0.25 mg total) by mouth at bedtime. 09/04/17 03/06/18 Yes Sainani, Belia Heman, MD  sertraline (ZOLOFT) 100 MG tablet Take 100 mg by mouth daily. 11/01/17  Yes [provider]  simvastatin (ZOCOR) 40 MG tablet Take 40 mg by mouth every evening.   Yes [provider]  metoprolol tartrate (LOPRESSOR) 25 MG tablet Take 1 tablet (25 mg total) by mouth 2 (two) times daily. 03/06/18 03/06/19   Merlyn Lot, MD    Allergies Ace inhibitors    Social History Social History   Tobacco Use  . Smoking status: Former Smoker    Packs/day: 1.50    Years: 25.00    Pack years: 37.50    Types: Cigarettes    Last attempt to quit: 03/24/1978    Years since quitting: 39.9  . Smokeless tobacco: Never Used  Substance Use Topics  . Alcohol use: No  . Drug use: No    Review of Systems Patient denies headaches, rhinorrhea, blurry vision, numbness, shortness of breath, chest pain, edema, cough, abdominal pain, nausea, vomiting, diarrhea, dysuria, fevers, rashes or hallucinations unless otherwise stated above in HPI. ____________________________________________   PHYSICAL EXAM:  VITAL SIGNS: Vitals:   03/06/18 2200 03/06/18 2215  BP: (!) 151/110   Pulse: 73 68  Resp: 20 20  Temp:    SpO2: 98% 97%    Constitutional: Alert and oriented.  Eyes: Conjunctivae are normal.  Head: Atraumatic. Nose: No congestion/rhinnorhea. Mouth/Throat: Mucous membranes are moist.   Neck: No stridor. Painless ROM.  Cardiovascular: Normal rate, regular rhythm. Grossly normal heart sounds.  Good peripheral circulation. Respiratory: Normal respiratory effort.  No retractions. Lungs CTAB. Gastrointestinal: Soft and nontender. No distention. No abdominal bruits. No CVA tenderness. Genitourinary:  Musculoskeletal: No lower extremity tenderness nor edema.  No joint effusions. Neurologic:  Normal speech and language. No gross focal neurologic deficits are appreciated. No facial droop Skin:  Skin is warm, dry and intact. No rash noted. Psychiatric: Mood and affect are normal. Speech and behavior are normal.  ____________________________________________   LABS (all labs ordered are listed, but only abnormal results are displayed)  Results for orders placed or performed during the hospital encounter of 03/06/18 (from the past 24 hour(s))  CBC with Differential/Platelet     Status: Abnormal    Collection Time: 03/06/18  8:24 PM  Result Value Ref Range   WBC 7.2 4.0 - 10.5 K/uL   RBC 4.01 (L) 4.22 - 5.81 MIL/uL   Hemoglobin 13.0 13.0 - 17.0 g/dL   HCT 37.0 (L) 39.0 - 52.0 %   MCV 92.3 80.0 - 100.0 fL   MCH 32.4 26.0 - 34.0 pg   MCHC 35.1 30.0 - 36.0 g/dL   RDW 11.3 (L) 11.5 - 15.5 %   Platelets 194 150 - 400 K/uL   nRBC 0.0 0.0 - 0.2 %   Neutrophils Relative % 55 %   Neutro Abs 3.9 1.7 - 7.7 K/uL   Lymphocytes Relative 31 %   Lymphs Abs 2.3 0.7 - 4.0 K/uL   Monocytes Relative 10 %   Monocytes Absolute 0.7 0.1 - 1.0 K/uL   Eosinophils Relative 4 %   Eosinophils Absolute 0.3 0.0 - 0.5 K/uL   Basophils Relative 0 %   Basophils Absolute 0.0 0.0 - 0.1 K/uL   Immature Granulocytes 0 %   Abs Immature Granulocytes 0.02 0.00 -  0.07 K/uL  Comprehensive metabolic panel     Status: Abnormal   Collection Time: 03/06/18  8:24 PM  Result Value Ref Range   Sodium 137 135 - 145 mmol/L   Potassium 3.6 3.5 - 5.1 mmol/L   Chloride 106 98 - 111 mmol/L   CO2 22 22 - 32 mmol/L   Glucose, Bld 180 (H) 70 - 99 mg/dL   BUN 16 8 - 23 mg/dL   Creatinine, Ser 0.94 0.61 - 1.24 mg/dL   Calcium 8.8 (L) 8.9 - 10.3 mg/dL   Total Protein 7.0 6.5 - 8.1 g/dL   Albumin 4.0 3.5 - 5.0 g/dL   AST 21 15 - 41 U/L   ALT 14 0 - 44 U/L   Alkaline Phosphatase 105 38 - 126 U/L   Total Bilirubin 0.7 0.3 - 1.2 mg/dL   GFR calc non Af Amer >60 >60 mL/min   GFR calc Af Amer >60 >60 mL/min   Anion gap 9 5 - 15   ____________________________________________  EKG My review and personal interpretation at Time: 20:09   Indication: palpitations  Rate: 75  Rhythm: sinus Axis: normal Other: normal intervals, no stemi ____________________________________________  RADIOLOGY   ____________________________________________   PROCEDURES  Procedure(s) performed:  Procedures    Critical Care performed: no ____________________________________________   INITIAL IMPRESSION / ASSESSMENT AND PLAN / ED  COURSE  Pertinent labs & imaging results that were available during my care of the patient were reviewed by me and considered in my medical decision making (see chart for details).   DDX: dysrhtymia, svt, afib, vtach  RAIFE LIZER is a 78 y.o. who presents to the ED with palpitations.  Has a history of SVT.  Patient presents with recurrent SVT.  The patient will be placed on continuous pulse oximetry and telemetry for monitoring.  Laboratory evaluation will be sent to evaluate for the above complaints.    Currently pain free.  Clinical Course as of Mar 06 2334  Wed Mar 06, 2018  2206 Patient reassessed.  Remains hemodynamically stable.   [PR]  2249 Patient tolerating oral oral Lopressor.  I consulted with Dr. Lovena Le of cardiology regarding further management.  Agrees with plan for restarting Lopressor.  Patient in no acute distress.  Stable appropriate for outpatient follow-up.   [PR]    Clinical Course User Index [PR] Merlyn Lot, MD     As part of my medical decision making, I reviewed the following data within the [facility] notes reviewed and incorporated, Labs reviewed, notes from prior ED visits   ____________________________________________   FINAL CLINICAL IMPRESSION(S) / ED DIAGNOSES  Final diagnoses:  SVT (supraventricular tachycardia) ([place])      NEW MEDICATIONS STARTED DURING THIS VISIT:  New Prescriptions   METOPROLOL TARTRATE (LOPRESSOR) 25 MG TABLET    Take 1 tablet (25 mg total) by mouth 2 (two) times daily.     Note:  This document was prepared using Dragon voice recognition software and may include unintentional dictation errors.    Merlyn Lot, MD 03/06/18 [phone]

## 2018-03-06 NOTE — Telephone Encounter (Signed)
Pt advised to arrive at 9:30 am on 1/22 for his ablation and not 6:30 as originally instructed. Patient verbalized understanding and agreeable to plan.

## 2018-03-08 ENCOUNTER — Telehealth: Payer: Self-pay | Admitting: Cardiovascular Disease

## 2018-03-08 NOTE — Telephone Encounter (Signed)
See previous notes   Patient recent ED visit for SVT and was told to fu   He is scheduled for procedure with [facility] on 1/22 and is not sure if the plan has changed or he needs to be evaluated in office before then  Please advise

## 2018-03-08 NOTE — Telephone Encounter (Signed)
Spoke with patient and he wanted to see if he should follow up from ED visit. He has upcoming procedure scheduled on Thursday in [place]. Recommended that he keep that procedure date and time along with follow up with that provider and that I would send his nurse a message to let her know he was seen in ED. He was appreciative for the call with no further questions at this time.

## 2018-03-13 ENCOUNTER — Ambulatory Visit (HOSPITAL_COMMUNITY)
Admission: RE | Disposition: A | Discharge status: Home / Self Care | Payer: Self-pay | Source: Ambulatory Visit | Attending: Cardiology

## 2018-03-13 ENCOUNTER — Other Ambulatory Visit: Payer: Self-pay

## 2018-03-13 ENCOUNTER — Ambulatory Visit (HOSPITAL_COMMUNITY)
Admission: RE | Admit: 2018-03-13 | Discharge: 2018-03-13 | Disposition: A | Discharge status: Home / Self Care | Payer: Medicare Other | Source: Ambulatory Visit | Attending: Cardiology | Admitting: Cardiology

## 2018-03-13 DIAGNOSIS — E119 Type 2 diabetes mellitus without complications: Secondary | ICD-10-CM | POA: Insufficient documentation

## 2018-03-13 DIAGNOSIS — Z955 Presence of coronary angioplasty implant and graft: Secondary | ICD-10-CM | POA: Diagnosis not present

## 2018-03-13 DIAGNOSIS — Z7982 Long term (current) use of aspirin: Secondary | ICD-10-CM | POA: Insufficient documentation

## 2018-03-13 DIAGNOSIS — Z8249 Family history of ischemic heart disease and other diseases of the circulatory system: Secondary | ICD-10-CM | POA: Diagnosis not present

## 2018-03-13 DIAGNOSIS — Z87891 Personal history of nicotine dependence: Secondary | ICD-10-CM | POA: Diagnosis not present

## 2018-03-13 DIAGNOSIS — Z79899 Other long term (current) drug therapy: Secondary | ICD-10-CM | POA: Diagnosis not present

## 2018-03-13 DIAGNOSIS — E785 Hyperlipidemia, unspecified: Secondary | ICD-10-CM | POA: Insufficient documentation

## 2018-03-13 DIAGNOSIS — Z888 Allergy status to other drugs, medicaments and biological substances status: Secondary | ICD-10-CM | POA: Diagnosis not present

## 2018-03-13 DIAGNOSIS — I10 Essential (primary) hypertension: Secondary | ICD-10-CM | POA: Diagnosis not present

## 2018-03-13 DIAGNOSIS — I252 Old myocardial infarction: Secondary | ICD-10-CM | POA: Diagnosis not present

## 2018-03-13 DIAGNOSIS — I471 Supraventricular tachycardia: Secondary | ICD-10-CM

## 2018-03-13 DIAGNOSIS — I2510 Atherosclerotic heart disease of native coronary artery without angina pectoris: Secondary | ICD-10-CM | POA: Insufficient documentation

## 2018-03-13 DIAGNOSIS — Z7984 Long term (current) use of oral hypoglycemic drugs: Secondary | ICD-10-CM | POA: Insufficient documentation

## 2018-03-13 DIAGNOSIS — Z951 Presence of aortocoronary bypass graft: Secondary | ICD-10-CM | POA: Insufficient documentation

## 2018-03-13 HISTORY — PX: SVT ABLATION: EP1225

## 2018-03-13 LAB — GLUCOSE, CAPILLARY
GLUCOSE-CAPILLARY: 167 mg/dL — AB (ref 70–99)
Glucose-Capillary: 122 mg/dL — ABNORMAL HIGH (ref 70–99)

## 2018-03-13 SURGERY — SVT ABLATION

## 2018-03-13 MED ORDER — BUPIVACAINE HCL (PF) 0.25 % IJ SOLN
INTRAMUSCULAR | Status: DC | PRN
  Administered 2018-03-13: 15 mL

## 2018-03-13 MED ORDER — FENTANYL CITRATE (PF) 100 MCG/2ML IJ SOLN
INTRAMUSCULAR | Status: AC
  Filled 2018-03-13: qty 2

## 2018-03-13 MED ORDER — ONDANSETRON HCL 4 MG/2ML IJ SOLN: 4.0000 mg | Freq: Four times a day (QID) | INTRAMUSCULAR | Status: DC | PRN

## 2018-03-13 MED ORDER — MIDAZOLAM HCL 5 MG/5ML IJ SOLN
INTRAMUSCULAR | Status: DC | PRN
  Administered 2018-03-13 (×3): 1 mg via INTRAVENOUS

## 2018-03-13 MED ORDER — ISOPROTERENOL HCL 0.2 MG/ML IJ SOLN
INTRAVENOUS | Status: DC | PRN
  Administered 2018-03-13: 2 ug/min via INTRAVENOUS

## 2018-03-13 MED ORDER — SODIUM CHLORIDE 0.9 % IV SOLN
INTRAVENOUS | Status: DC
  Administered 2018-03-13: 10:00:00 via INTRAVENOUS

## 2018-03-13 MED ORDER — SODIUM CHLORIDE 0.9% FLUSH: 3.0000 mL | Freq: Two times a day (BID) | INTRAVENOUS | Status: DC

## 2018-03-13 MED ORDER — ISOPROTERENOL HCL 0.2 MG/ML IJ SOLN
INTRAMUSCULAR | Status: AC
  Filled 2018-03-13: qty 5

## 2018-03-13 MED ORDER — SODIUM CHLORIDE 0.9% FLUSH: 3.0000 mL | INTRAVENOUS | Status: DC | PRN

## 2018-03-13 MED ORDER — SODIUM CHLORIDE 0.9 % IV SOLN: 250.0000 mL | INTRAVENOUS | Status: DC | PRN

## 2018-03-13 MED ORDER — ACETAMINOPHEN 325 MG PO TABS
650.0000 mg | ORAL_TABLET | ORAL | Status: DC | PRN
  Filled 2018-03-13: qty 2

## 2018-03-13 MED ORDER — MIDAZOLAM HCL 5 MG/5ML IJ SOLN
INTRAMUSCULAR | Status: AC
  Filled 2018-03-13: qty 5

## 2018-03-13 MED ORDER — HEPARIN (PORCINE) IN NACL 1000-0.9 UT/500ML-% IV SOLN
INTRAVENOUS | Status: AC
  Filled 2018-03-13: qty 500

## 2018-03-13 MED ORDER — FENTANYL CITRATE (PF) 100 MCG/2ML IJ SOLN
INTRAMUSCULAR | Status: DC | PRN
  Administered 2018-03-13 (×2): 25 ug via INTRAVENOUS
  Administered 2018-03-13: 12.5 ug via INTRAVENOUS

## 2018-03-13 MED ORDER — BUPIVACAINE HCL (PF) 0.25 % IJ SOLN
INTRAMUSCULAR | Status: AC
  Filled 2018-03-13: qty 60

## 2018-03-13 MED ORDER — SODIUM CHLORIDE 0.9 % IV SOLN: INTRAVENOUS | Status: DC | PRN

## 2018-03-13 SURGICAL SUPPLY — 14 items
BAG SNAP BAND KOVER 36X36 (MISCELLANEOUS) ×2 IMPLANT
CATH EZ STEER NAV 4MM D-F CUR (ABLATOR) ×2 IMPLANT
CATH JOSEPH QUAD ALLRED 6F REP (CATHETERS) ×4 IMPLANT
CATH WEBSTER BI DIR CS D-F CRV (CATHETERS) ×2 IMPLANT
PACK EP LATEX FREE (CUSTOM PROCEDURE TRAY) ×3
PACK EP LF (CUSTOM PROCEDURE TRAY) ×1 IMPLANT
PAD PRO RADIOLUCENT 2001M-C (PAD) ×3 IMPLANT
PATCH CARTO3 (PAD) ×2 IMPLANT
PINNACLE LONG 7F 25CM (SHEATH) ×3
SHEATH INTRO PINNACLE 7F 25CM (SHEATH) IMPLANT
SHEATH PINNACLE 6F 10CM (SHEATH) ×4 IMPLANT
SHEATH PINNACLE 7F 10CM (SHEATH) ×2 IMPLANT
SHEATH PINNACLE 8F 10CM (SHEATH) ×2 IMPLANT
WIRE HI TORQ VERSACORE-J 145CM (WIRE) ×2 IMPLANT

## 2018-03-13 NOTE — Progress Notes (Signed)
Site: left femoral Sheath Size: 6, 16fr Condition prior to removal:  Level 0 Type of pressure held: manual Time pressure held: 10 mins. Status of patient during pull: stable Condition of site post pull:  Level 0 Type of dressing applied: gauze w/ transparent Pulses verified: let DP Patient's condition post pull: stable Bedrest begins at: 1310 Post instructions given to patient: yes  Sheaths pulled by [employer]

## 2018-03-13 NOTE — H&P (Signed)
Johnny [name] has presented today for surgery, with the diagnosis of SVT.  The various methods of treatment have been discussed with the patient and family. After consideration of risks, benefits and other options for treatment, the patient has consented to  Procedure(s): Catheter ablation as a surgical intervention .  Risks include but not limited to bleeding, tamponade, heart block, stroke, damage to surrounding organs, among others. The patient's history has been reviewed, patient examined, no change in status, stable for surgery.  I have reviewed the patient's chart and labs.  Questions were answered to the patient's satisfaction.    Luretta Everly Curt Bears, MD 03/13/2018 9:07 AM

## 2018-03-13 NOTE — Progress Notes (Signed)
Site: right femoral Sheath Size: 6, 84fr Condition prior to removal:  Level 0 Type of pressure held: manual  Time pressure held: 10 mins. Status of patient during pull: stable Condition of site post pull:  Level 0 Type of dressing applied: gauze w/ transparnt Pulses verified: right DP - weak Patient's condition post pull: stable Bedrest begins at:  1310 Post instructions given to patient: yes

## 2018-03-13 NOTE — Discharge Instructions (Signed)

## 2018-03-14 ENCOUNTER — Encounter (HOSPITAL_COMMUNITY): Payer: Self-pay | Admitting: Cardiology

## 2018-03-14 MED FILL — Heparin Sod (Porcine)-NaCl IV Soln 1000 Unit/500ML-0.9%: INTRAVENOUS | Qty: 1000 | Status: AC

## 2018-03-21 ENCOUNTER — Encounter: Payer: Self-pay | Admitting: Cardiology

## 2018-04-15 ENCOUNTER — Ambulatory Visit: Payer: Medicare Other | Admitting: Cardiology

## 2018-04-15 ENCOUNTER — Encounter: Payer: Self-pay | Admitting: Cardiology

## 2018-04-15 VITALS — BP 122/74 | HR 60 | Ht 70.0 in | Wt 176.0 lb

## 2018-04-15 DIAGNOSIS — I471 Supraventricular tachycardia: Secondary | ICD-10-CM | POA: Diagnosis not present

## 2018-04-15 NOTE — Progress Notes (Signed)
Electrophysiology Office Note   Date:  04/15/2018   ID:  Johnny [name], DOB [birth year], MRN [id number]  PCP:  Johnny Hire, MD  Cardiologist:  Johnny [name] Primary Electrophysiologist:  Johnny Lizotte Meredith Leeds, MD    No chief complaint on file.    History of Present Illness: Johnny [name] is a 78 y.o. male who is being seen today for the evaluation of SVT at the request of Johnny Hire, MD. Presenting today for electrophysiology evaluation.  He has a history of coronary disease status post CABG in 1998 with cardiac arrest following the procedure, MI in the 1980s, ischemic cardiomyopathy, hyperlipidemia, SVT.  He presented the emergency room 01/25/2018 with palpitations.  This was associated with chest pain with radiation to the jaw.  His heart rate was in the 160s.  He did vagal maneuvers without success.  He was given 6 mg of adenosine which terminated his SVT.  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  Today, denies symptoms of palpitations, chest pain, shortness of breath, orthopnea, PND, lower extremity edema, claudication, dizziness, presyncope, syncope, bleeding, or neurologic sequela. The patient is tolerating medications without difficulties.  He is currently feeling well.  He has had no further episodes of SVT since ablation.   Past Medical History:  Diagnosis Date  . Anginal pain ([place])   . Coronary artery disease 1998   CABG x 3  . Depression   . Diabetes mellitus without complication ([place])   . Hyperlipidemia   . Hypertension   . Myocardial infarction ([place])   . SVT (supraventricular tachycardia) ([facility])    Past Surgical History:  Procedure Laterality Date  . CARDIAC CATHETERIZATION Left 12/07/2015   Procedure: Left Heart Cath and Cors/Grafts Angiography;  Surgeon: Johnny Skains, MD;  Location: [facility];  Service: Cardiovascular;  Laterality: Left;  . CORONARY ARTERY BYPASS GRAFT  1998   [facility]  . SVT ABLATION N/A 03/13/2018   Procedure: SVT  ABLATION;  Surgeon: Johnny Haw, MD;  Location: [facility];  Service: Cardiovascular;  Laterality: N/A;     Current Outpatient Medications  Medication Sig Dispense Refill  . allopurinol (ZYLOPRIM) 100 MG tablet Take 100 mg by mouth daily.    Marland Kitchen aspirin EC 81 MG tablet Take 1 tablet (81 mg total) by mouth daily. 90 tablet 0  . ezetimibe (ZETIA) 10 MG tablet Take 1 tablet (10 mg total) by mouth daily. 90 tablet 0  . fludrocortisone (FLORINEF) 0.1 MG tablet Take 1 tablet (0.1 mg total) by mouth daily. 90 tablet 1  . glipiZIDE (GLUCOTROL) 10 MG tablet Take 10 mg by mouth 2 (two) times daily before a meal.    . metFORMIN (GLUCOPHAGE) 1000 MG tablet Take 1,000 mg by mouth 2 (two) times daily with a meal.     . metoprolol tartrate (LOPRESSOR) 25 MG tablet Take 1 tablet (25 mg total) by mouth 2 (two) times daily. 60 tablet 0  . midodrine (PROAMATINE) 10 MG tablet Take 1 tablet (10 mg total) by mouth 3 (three) times daily as needed. (Patient taking differently: Take 10 mg by mouth 3 (three) times daily. ) 90 tablet 3  . omeprazole (PRILOSEC) 20 MG capsule Take 20 mg by mouth daily.    Marland Kitchen rOPINIRole (REQUIP) 0.25 MG tablet Take 1 tablet (0.25 mg total) by mouth at bedtime. 30 tablet 1  . sertraline (ZOLOFT) 100 MG tablet Take 100 mg by mouth daily.    . simvastatin (ZOCOR) 40  MG tablet Take 40 mg by mouth every evening.     No current facility-administered medications for this visit.     Allergies:   Ace inhibitors   Social History:  The patient  reports that he quit smoking about 40 years ago. His smoking use included cigarettes. He has a 37.50 pack-year smoking history. He has never used smokeless tobacco. He reports that he does not drink alcohol or use drugs.   Family History:  The patient's family history includes Heart attack in his brother, brother, father, sister, sister, and sister.   ROS:  Please see the history of present illness.   Otherwise, review of systems is positive  for none.   All other systems are reviewed and negative.   PHYSICAL EXAM: VS:  BP 122/74   Pulse 60   Ht 5\' 10"  (1.778 m)   Wt 176 lb (79.8 kg)   BMI 25.25 kg/m  , BMI Body mass index is 25.25 kg/m. GEN: Well nourished, well developed, in no acute distress  HEENT: normal  Neck: no JVD, carotid bruits, or masses Cardiac: RRR; no murmurs, rubs, or gallops,no edema  Respiratory:  clear to auscultation bilaterally, normal work of breathing GI: soft, nontender, nondistended, + BS MS: no deformity or atrophy  Skin: warm and dry Neuro:  Strength and sensation are intact Psych: euthymic mood, full affect  EKG:  EKG is ordered today. Personal review of the ekg ordered shows sinus rhythm, rate 60, septal Q waves, inferolateral T wave inversions  Recent Labs: 09/03/2017: TSH 2.490 03/06/2018: ALT 14; BUN 16; Creatinine, Ser 0.94; Hemoglobin 13.0; Platelets 194; Potassium 3.6; Sodium 137    Lipid Panel     Component Value Date/Time   CHOL 158 04/25/2012 1011   TRIG 97 04/25/2012 1011   HDL 77 (H) 04/25/2012 1011   VLDL 19 04/25/2012 1011   LDLCALC 62 04/25/2012 1011     Wt Readings from Last 3 Encounters:  04/15/18 176 lb (79.8 kg)  03/13/18 175 lb (79.4 kg)  03/06/18 172 lb 13.5 oz (78.4 kg)      Other studies Reviewed: Additional studies/ records that were reviewed today include: TTE 09/04/17  Review of the above records today demonstrates:  - Left ventricle: The cavity size was mildly dilated. Systolic   function was moderately reduced. The estimated ejection fraction   was in the range of 35% to 40%. Hypokinesis of the inferior   myocardium. Hypokinesis of the inferoseptal myocardium.   Hypokinesis of the apical myocardium. - Aortic valve: There was trivial regurgitation. Valve area (VTI):   1.75 cm^2. Valve area (Vmax): 1.62 cm^2. Valve area (Vmean): 1.49   cm^2. - Mitral valve: There was mild regurgitation. - Left atrium: The atrium was mildly  dilated.   ASSESSMENT AND PLAN:  1.  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  No changes.  He Johnny [name] follow-up as needed.  2.  Coronary artery disease status post CABG: Currently feeling well without chest pain.  3.  Ischemic cardiomyopathy: No obvious signs of volume overload.  Current medicines are reviewed at length with the patient today.   The patient does not have concerns regarding his medicines.  The following changes were made today: None  Labs/ tests ordered today include:  Orders Placed This Encounter  Procedures  . EKG 12-Lead    Disposition:   FU with Mekhi Sonn as needed months  Signed, Joseguadalupe Stan Meredith Leeds, MD  04/15/2018 3:59 PM     [facility] [address]  [address] [place] [address] [phone] (office) [phone] (fax)

## 2018-04-15 NOTE — Patient Instructions (Signed)
Medication Instructions:  Your physician recommends that you continue on your current medications as directed. Please refer to the Current Medication list given to you today.  * If you need a refill on your cardiac medications before your next appointment, please call your pharmacy.   Labwork: None ordered  Testing/Procedures: None ordered  Follow-Up: No follow up is needed at this time with Dr. Camnitz.  He will see you on an as needed basis.   Thank you for choosing [facility]!!   Tosh Glaze, RN [phone]     

## 2018-04-19 ENCOUNTER — Other Ambulatory Visit: Payer: Self-pay | Admitting: Cardiovascular Disease

## 2018-06-26 ENCOUNTER — Other Ambulatory Visit: Payer: Self-pay | Admitting: Cardiovascular Disease

## 2019-02-09 NOTE — Progress Notes (Signed)
Cardiology Office Note  Date:  02/10/2019   ID:  Johnny [name], DOB April 29, 1940, MRN [id number]  PCP:  Baxter Hire, MD   Chief Complaint  Patient presents with  . other    12 month follow up. Meds reviewed by the pt. verbally. "doing well." Pt. c/o some shortness of breath.     HPI:  [name] is a 78 y.o. male with  MI 4s CAD, CABG 1998, cardiac arrest following the procedure Ischemic cardiomyopathy ejection fraction 35 to 40% on echocardiogram 2019 Cath 11/2015 patent grafts x3 hyperlipidemia  SVT, adenosine 08/2017 Had SVT x 3 total (dec 2018, 08/2017 ( needed adenosine), 11/2017) Diabetes Chronic shortness of breath with overexertion Presenting for follow-up of his coronary disease, shortness of breath  emergency room 01/25/2018 with palpitations.    associated with chest pain with radiation to the jaw.   His heart rate was in the 160s.   vagal maneuvers without success.  given 6 mg of adenosine which terminated his SVT.    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  no further episodes of SVT since ablation  zetia expensive  Stopped taking  Has not had to take NTG since ablation  He is uncertain today he is taking midodrine or Florinef He will need to call us back Previously was taking midodrine at a.m. 2 PM, has helped his dizziness Previously walking with a cane, walking without a cane today  Chronic shortness of breath   telemetry strips from July 2019 documenting narrow complex tachycardia rate 169 bpm that broke with adenosine  EKG personally reviewed by myself on todays visit Normal sinus rhythm rate 91 bpm poor R wave progression to the anterior precordial leads left axis deviation  Other past medical history reviewed  hospitalization  new onset of palpitations chest pain and neck fluttering 09/03/2017  suddenly when he was at home.  seen by the EMS.   Possible SVT,  given adenosine  resolved within 3 to 5 minutes.     echocardiogram July 2019  LV dysfunction of 35 to 40% cardiac catheterization which showed EF of 40% in 2017  Remote weight loss from pancreas Chronic dizziness, since that time Used to be on losartan, medication was weaned off but continues to have orthostasis that is debilitating Feels it is affecting his life and making his balance worse  Prior cardiac catheterization results from 2017  Ramus lesion, 95 %stenosed.  Mid RCA lesion, 100 %stenosed.  SVG graft was visualized by angiography and is small.  Origin to Prox Graft lesion, 50 %stenosed.  SVG graft was visualized by angiography and is normal in caliber and anatomically normal.  Post Atrio lesion, 100 %stenosed.  Ost Cx to Mid Cx lesion, 40 %stenosed.  Dist RCA lesion, 100 %stenosed.  Mid LAD lesion, 85 %stenosed.  LM lesion, 30 %stenosed.  Ost 1st Mrg to 1st Mrg lesion, 65 %stenosed.  LIMA graft was visualized by angiography and is normal in caliber and anatomically normal.     PMH:   has a past medical history of Anginal pain ([place]), Coronary artery disease (1998), Depression, Diabetes mellitus without complication ([place]), Hyperlipidemia, Hypertension, Myocardial infarction ([place]), and SVT (supraventricular tachycardia) ([place]).  PSH:    Past Surgical History:  Procedure Laterality Date  . CARDIAC CATHETERIZATION Left 12/07/2015   Procedure: Left Heart Cath and Cors/Grafts Angiography;  Surgeon: Corey Skains, MD;  Location: [facility];  Service: Cardiovascular;  Laterality: Left;  . CORONARY ARTERY  BYPASS GRAFT  1998   [facility]  . SVT ABLATION N/A 03/13/2018   Procedure: SVT ABLATION;  Surgeon: Constance Haw, MD;  Location: [facility];  Service: Cardiovascular;  Laterality: N/A;    Current Outpatient Medications  Medication Sig Dispense Refill  . allopurinol (ZYLOPRIM) 100 MG tablet Take 100 mg by mouth daily.    Marland Kitchen aspirin EC 81 MG tablet Take 1 tablet (81 mg total) by mouth daily. 90  tablet 0  . fludrocortisone (FLORINEF) 0.1 MG tablet Take 1 tablet (0.1 mg total) by mouth daily. 90 tablet 1  . glipiZIDE (GLUCOTROL) 10 MG tablet Take 10 mg by mouth 2 (two) times daily before a meal.    . metFORMIN (GLUCOPHAGE) 1000 MG tablet Take 1,000 mg by mouth 2 (two) times daily with a meal.     . metoprolol tartrate (LOPRESSOR) 25 MG tablet Take 1 tablet (25 mg total) by mouth 2 (two) times daily. 60 tablet 0  . midodrine (PROAMATINE) 10 MG tablet Take 1 tablet (10 mg total) by mouth 3 (three) times daily as needed. (Patient taking differently: Take 10 mg by mouth 3 (three) times daily. ) 90 tablet 3  . omeprazole (PRILOSEC) 20 MG capsule Take 20 mg by mouth daily.    Marland Kitchen rOPINIRole (REQUIP) 0.25 MG tablet Take 1 tablet (0.25 mg total) by mouth at bedtime. 30 tablet 1  . sertraline (ZOLOFT) 100 MG tablet Take 100 mg by mouth daily.    . simvastatin (ZOCOR) 40 MG tablet Take 40 mg by mouth every evening.     No current facility-administered medications for this visit.    Allergies:   Ace inhibitors   Social History:  The patient  reports that he quit smoking about 40 years ago. His smoking use included cigarettes. He has a 37.50 pack-year smoking history. He has never used smokeless tobacco. He reports that he does not drink alcohol or use drugs.   Family History:   family history includes Heart attack in his brother, brother, father, sister, sister, and sister.    Review of Systems: Review of Systems  Constitutional: Negative.   HENT: Negative.   Respiratory: Positive for shortness of breath.   Cardiovascular: Negative.   Gastrointestinal: Negative.   Musculoskeletal: Negative.   Neurological: Negative.   Psychiatric/Behavioral: Negative.   All other systems reviewed and are negative.   PHYSICAL EXAM: VS:  BP 133/78 (BP Location: Left Arm, Patient Position: Sitting, Cuff Size: Normal)   Pulse 91   Ht 5\' 10"  (1.778 m)   Wt 174 lb 8 oz (79.2 kg)   BMI 25.04 kg/m  , BMI  Body mass index is 25.04 kg/m. Constitutional:  oriented to person, place, and time. No distress.  HENT:  Head: Grossly normal Eyes:  no discharge. No scleral icterus.  Neck: No JVD, no carotid bruits  Cardiovascular: Regular rate and rhythm, no murmurs appreciated Pulmonary/Chest: Clear to auscultation bilaterally, no wheezes or rails Abdominal: Soft.  no distension.  no tenderness.  Musculoskeletal: Normal range of motion Neurological:  normal muscle tone. Coordination normal. No atrophy Skin: Skin warm and dry Psychiatric: normal affect, pleasant   Recent Labs: 03/06/2018: ALT 14; BUN 16; Creatinine, Ser 0.94; Hemoglobin 13.0; Platelets 194; Potassium 3.6; Sodium 137    Lipid Panel Lab Results  Component Value Date   CHOL 158 04/25/2012   HDL 77 (H) 04/25/2012   LDLCALC 62 04/25/2012   TRIG 97 04/25/2012      Wt Readings from Last  3 Encounters:  02/10/19 174 lb 8 oz (79.2 kg)  04/15/18 176 lb (79.8 kg)  03/13/18 175 lb (79.4 kg)     ASSESSMENT AND PLAN:  Coronary artery disease of native artery of native heart with stable angina pectoris ([place]) -  Currently with no symptoms of angina. No further workup at this time. Continue current medication regimen.  SVT (supraventricular tachycardia) ([place]) - Plan: EKG 12-Lead Recent ablation, successful, no recurrence of his symptoms  Shortness of breath Deconditioned, no regular exercise program We did discuss stress test in the past, he declined Symptoms are stable  Orthostasis Noted in the past Previously was taking midodrine 10 mg twice a day 8 AM 2 PM  fludrocortisone daily added on prior office visit as he was having orthostasis symptoms He is not sure what medications he is taking, he will call us  Mixed hyperlipidemia Continue statin Coupon and new prescription for Zetia  Ischemic cardiomyopathy Ejection fraction 35 to 40%, euvolemic    Total encounter time more than 25 minutes  Greater than 50% was spent  in counseling and coordination of care with the patient    Orders Placed This Encounter  Procedures  . EKG 12-Lead     Signed, Esmond Plants, M.D., Ph.D. 02/10/2019  [place], [place]

## 2019-02-10 ENCOUNTER — Ambulatory Visit (INDEPENDENT_AMBULATORY_CARE_PROVIDER_SITE_OTHER): Payer: Medicare Other | Admitting: Cardiovascular Disease

## 2019-02-10 ENCOUNTER — Other Ambulatory Visit: Payer: Self-pay

## 2019-02-10 ENCOUNTER — Encounter: Payer: Self-pay | Admitting: Cardiovascular Disease

## 2019-02-10 VITALS — BP 133/78 | HR 91 | Ht 70.0 in | Wt 174.5 lb

## 2019-02-10 DIAGNOSIS — E782 Mixed hyperlipidemia: Secondary | ICD-10-CM

## 2019-02-10 DIAGNOSIS — I471 Supraventricular tachycardia: Secondary | ICD-10-CM | POA: Diagnosis not present

## 2019-02-10 DIAGNOSIS — I255 Ischemic cardiomyopathy: Secondary | ICD-10-CM | POA: Diagnosis not present

## 2019-02-10 DIAGNOSIS — I951 Orthostatic hypotension: Secondary | ICD-10-CM

## 2019-02-10 DIAGNOSIS — I25708 Atherosclerosis of coronary artery bypass graft(s), unspecified, with other forms of angina pectoris: Secondary | ICD-10-CM

## 2019-02-10 DIAGNOSIS — R0602 Shortness of breath: Secondary | ICD-10-CM

## 2019-02-10 MED ORDER — EZETIMIBE 10 MG PO TABS: 10.0000 mg | ORAL_TABLET | Freq: Every day | ORAL | 3 refills | Status: DC

## 2019-02-10 NOTE — Addendum Note (Signed)
Addended by: Valora Corporal on: 02/10/2019 01:30 PM   Modules accepted: Orders

## 2019-02-10 NOTE — Patient Instructions (Addendum)
Medication Instructions:  Zetia 10 mg daily for cholesterol   PLEASE check and see if you are taking MIDODRINE and FLUDROCORTISONE   If you need a refill on your cardiac medications before your next appointment, please call your pharmacy.    Lab work: No new labs needed   If you have labs (blood work) drawn today and your tests are completely normal, you will receive your results only by: Marland Kitchen MyChart Message (if you have MyChart) OR . A paper copy in the mail If you have any lab test that is abnormal or we need to change your treatment, we will call you to review the results.   Testing/Procedures: No new testing needed   Follow-Up: At [facility], you and your health needs are our priority.  As part of our continuing mission to provide you with exceptional heart care, we have created designated Provider Care Teams.  These Care Teams include your primary Cardiologist (physician) and Advanced Practice Providers (APPs -  Physician Assistants and Nurse Practitioners) who all work together to provide you with the care you need, when you need it.  . You will need a follow up appointment in 6 months   . Providers on your designated Care Team:   . Murray Hodgkins, NP . Christell Faith, PA-C . Marrianne Mood, PA-C  Any Other Special Instructions Will Be Listed Below (If Applicable).  For educational health videos Log in to : www.[email or web address] Or : [email or web address], password : [email or web address]

## 2019-07-14 ENCOUNTER — Other Ambulatory Visit: Payer: Self-pay | Admitting: Family Medicine

## 2019-07-14 DIAGNOSIS — G8929 Other chronic pain: Secondary | ICD-10-CM

## 2019-07-27 ENCOUNTER — Other Ambulatory Visit: Payer: Self-pay

## 2019-07-27 ENCOUNTER — Ambulatory Visit
Admission: RE | Admit: 2019-07-27 | Discharge: 2019-07-27 | Disposition: A | Discharge status: Home / Self Care | Payer: Medicare Other | Source: Ambulatory Visit | Attending: Family Medicine | Admitting: Family Medicine

## 2019-07-27 DIAGNOSIS — G8929 Other chronic pain: Secondary | ICD-10-CM | POA: Diagnosis present

## 2019-07-27 DIAGNOSIS — M5441 Lumbago with sciatica, right side: Secondary | ICD-10-CM

## 2019-07-27 IMAGING — MR MR LUMBAR SPINE W/O CM
5 series · 31 of 48 positions shown · non-contrast
Comparison: None.

CLINICAL DATA: Chronic back pain, remote history of surgery

EXAM:
MRI LUMBAR SPINE WITHOUT CONTRAST
TECHNIQUE: Multiplanar, multisequence MR imaging of the lumbar spine was
performed. No intravenous contrast was administered.

[Series 5: T2 · sagittal · 4.0mm · 0.81mm/px · 6 of 17 slices shown (1 of 2)]
[im 1/17]
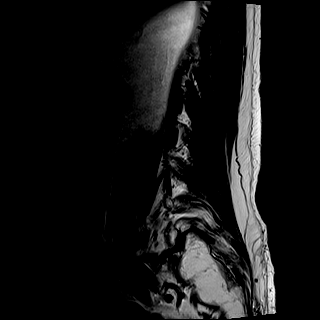
[im 4/17]
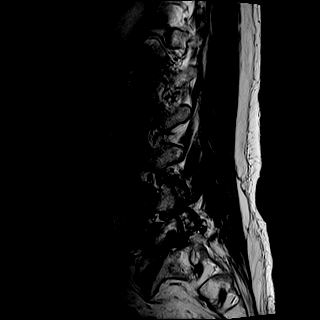
[im 7/17]
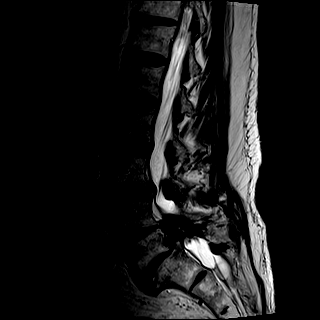
[im 10/17]
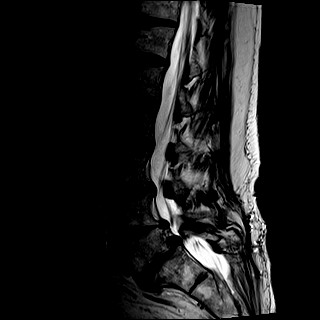
[im 13/17]
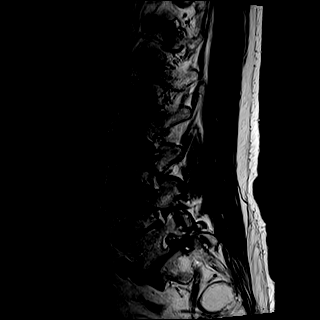
[im 17/17]
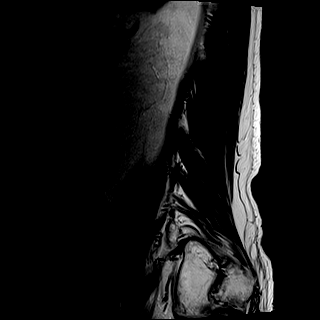

[Series 6: T1 · sagittal · 4.0mm · 0.81mm/px · 7 of 17 slices shown (1 of 2)]
[im 1/17]
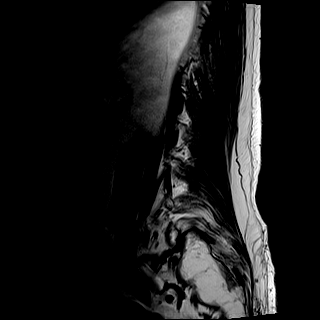
[im 3/17]
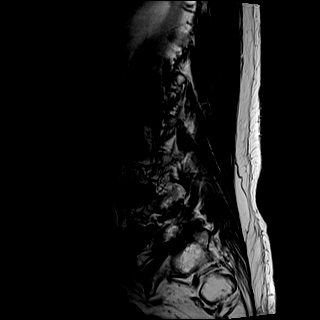
[im 6/17]
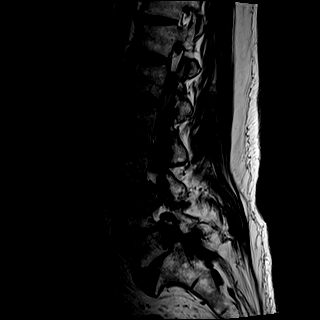
[im 9/17]
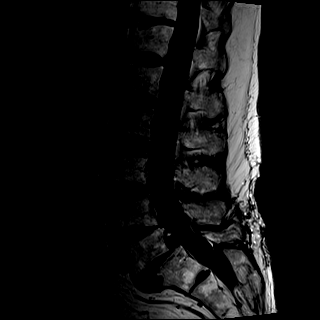
[im 11/17]
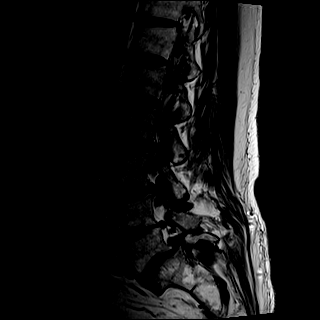
[im 14/17]
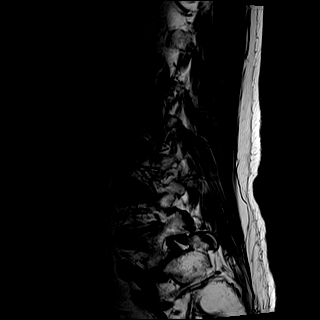
[im 17/17]
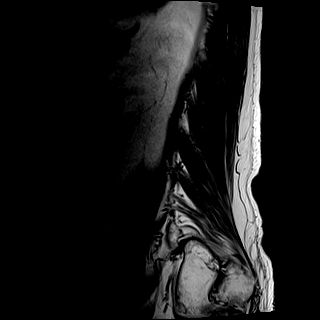

[Series 7: STIR · sagittal · 4.0mm · 0.41mm/px · 2 of 17 slices shown]
[im 1/17]
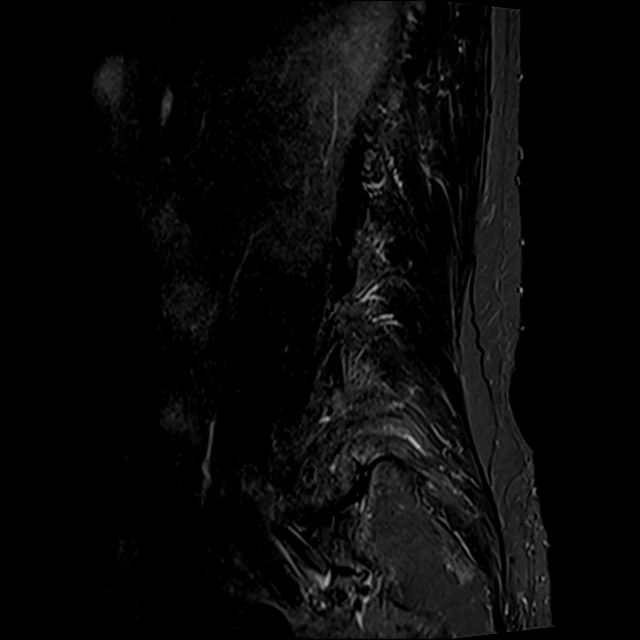
[im 3/17]
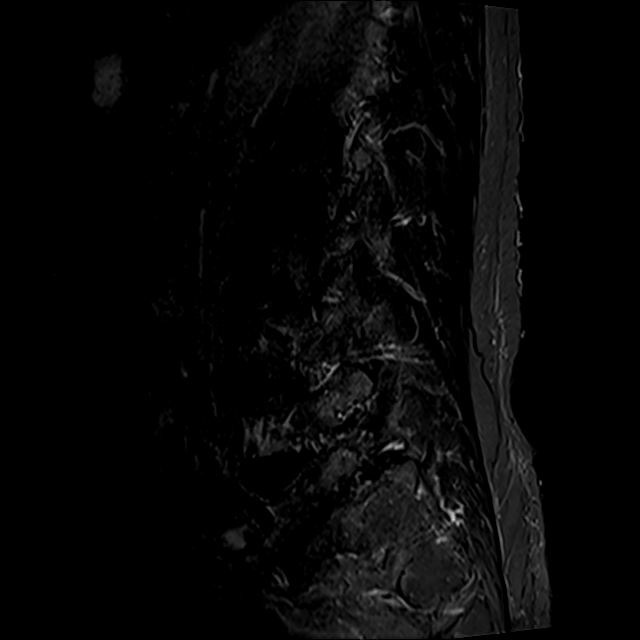

[Series 8: T2 · axial · 4.0mm · 0.78mm/px · z∈[-98,+142]mm · 8 of 37 slices shown (2 of 2)]
[im 1/37]
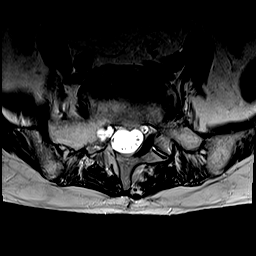
[im 6/37]
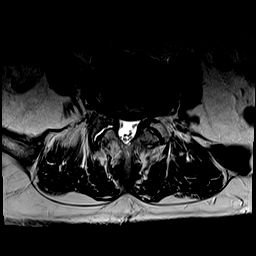
[im 12/37]
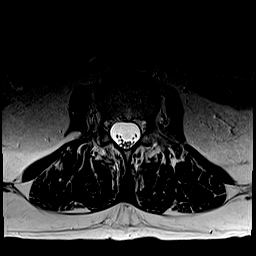
[im 17/37]
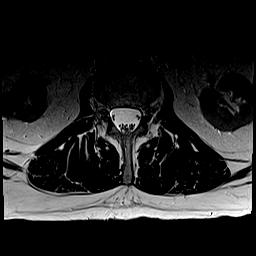
[im 20/37]
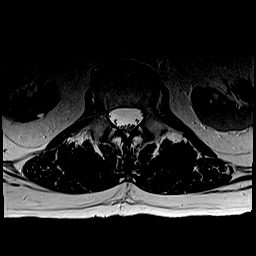
[im 25/37]
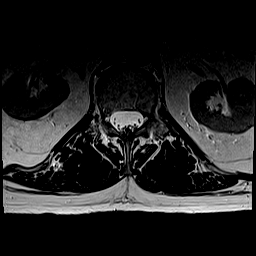
[im 31/37]
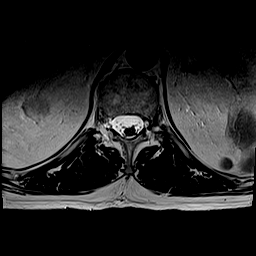
[im 37/37]
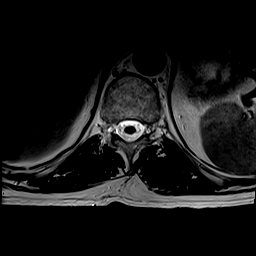

[Series 9: T1 · axial · 4.0mm · 0.39mm/px · z∈[-98,+142]mm · 8 of 37 slices shown (2 of 2)]
[im 1/37]
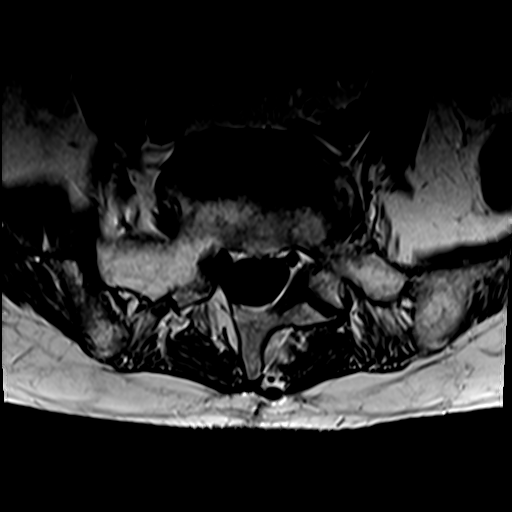
[im 6/37]
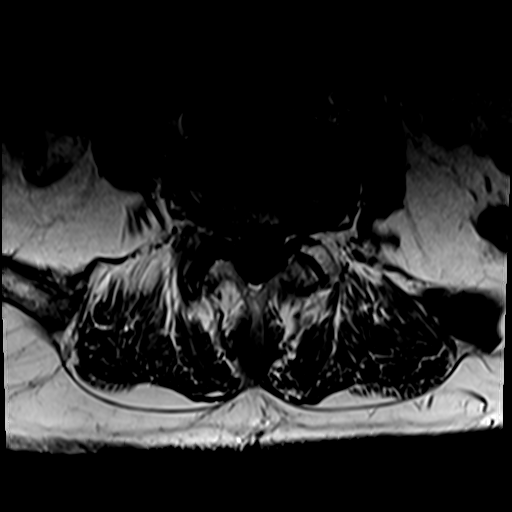
[im 12/37]
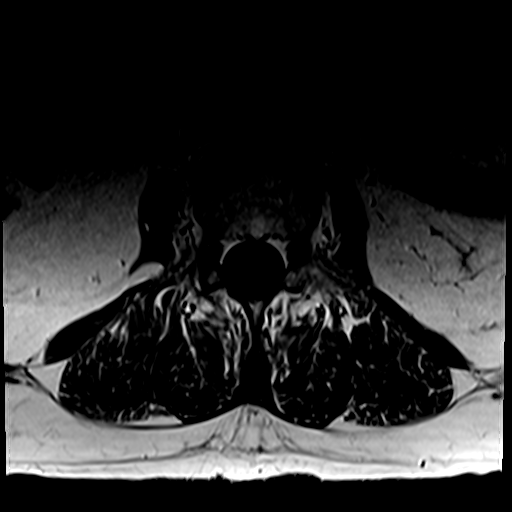
[im 17/37]
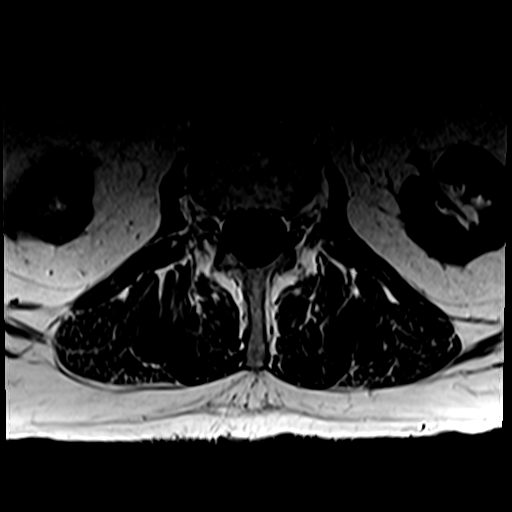
[im 20/37]
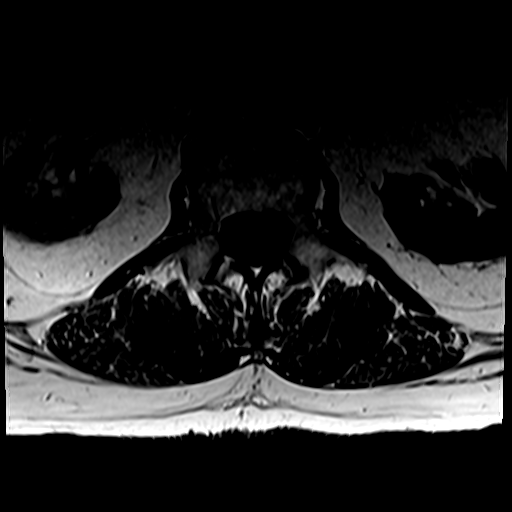
[im 25/37]
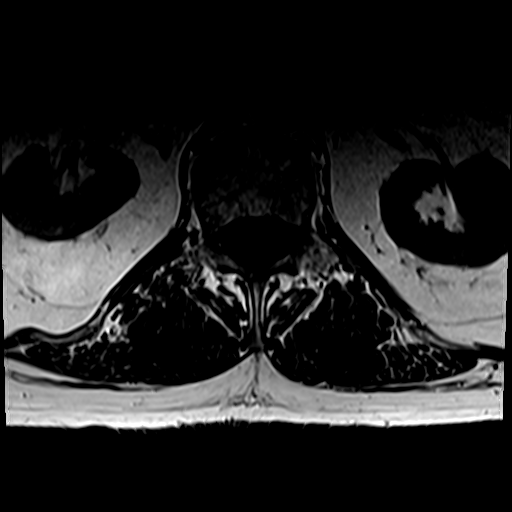
[im 31/37]
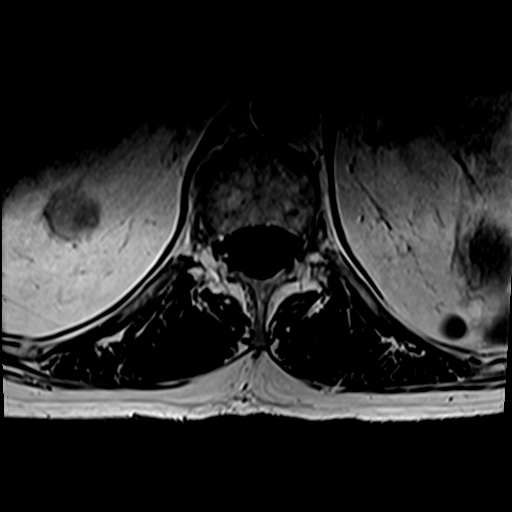
[im 37/37]
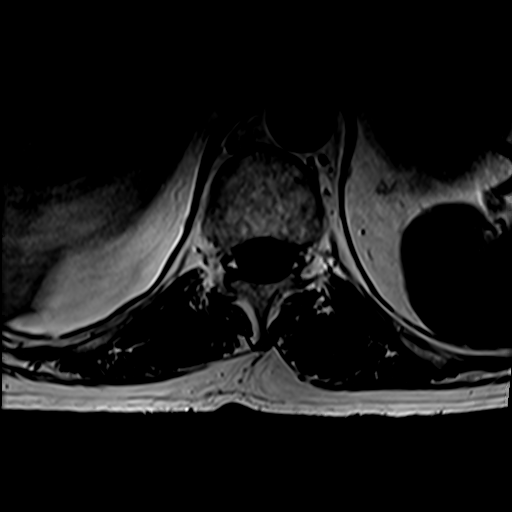

[31 of 48 positions shown; findings below may reference images not displayed]

FINDINGS: Segmentation:  Standard.

Alignment: Grade [DATE] anterolisthesis at L5-S1 secondary to chronic
bilateral L5 pars defects.

Vertebrae: No significant marrow edema. No suspicious osseous
lesion.

Conus medullaris and cauda equina: Conus extends to the L1 level.
Conus and cauda equina appear normal.

Paraspinal and other soft tissues: Unremarkable.

Disc levels:

L1-L2:  No canal or foraminal stenosis.

L2-L3: Mild disc bulge eccentric to the left. No canal or right
foraminal stenosis. Minor left foraminal stenosis.

L3-L4: Disc bulge with small central annular fissure. Facet
arthropathy with ligamentum flavum infolding. No significant canal
stenosis. Slight effacement of lateral recesses. Mild foraminal
stenosis.

L4-L5: Bulge with endplate osteophytic ridging. Facet arthropathy
with ligamentum flavum infolding. Mild canal stenosis. Effacement of
the right lateral recess with potential compression of the
traversing L5 nerve root. Moderate right and mild left foraminal
stenosis.

L5-S1: Possible prior operative changes. Anterolisthesis with
uncovering of disc bulge eccentric to the right. Marked facet
arthropathy with ligamentum flavum infolding. Mild canal stenosis.
Effacement of the right much greater than left lateral recesses with
potential compression of the traversing right S1 nerve root. Marked
right foraminal stenosis with compression of exiting L5 nerve root.
Moderate to marked left foraminal stenosis with potential
compression of exiting L5 nerve root.
IMPRESSION: Multilevel degenerative changes as detailed above. There is
potential nerve root compression at L4-L5 and L5-S1. Anterolisthesis
at L5-S1 secondary to chronic pars breaks.

## 2019-08-11 ENCOUNTER — Other Ambulatory Visit: Payer: Self-pay

## 2019-08-11 ENCOUNTER — Ambulatory Visit: Payer: Medicare Other | Admitting: Physician Assistant

## 2019-08-11 ENCOUNTER — Encounter: Payer: Self-pay | Admitting: Physician Assistant

## 2019-08-11 VITALS — BP 132/68 | HR 97 | Ht 69.0 in | Wt 177.1 lb

## 2019-08-11 DIAGNOSIS — I4719 Other supraventricular tachycardia: Secondary | ICD-10-CM

## 2019-08-11 DIAGNOSIS — I471 Supraventricular tachycardia, unspecified: Secondary | ICD-10-CM

## 2019-08-11 DIAGNOSIS — I951 Orthostatic hypotension: Secondary | ICD-10-CM

## 2019-08-11 DIAGNOSIS — I25708 Atherosclerosis of coronary artery bypass graft(s), unspecified, with other forms of angina pectoris: Secondary | ICD-10-CM

## 2019-08-11 DIAGNOSIS — R0602 Shortness of breath: Secondary | ICD-10-CM | POA: Diagnosis not present

## 2019-08-11 DIAGNOSIS — I25118 Atherosclerotic heart disease of native coronary artery with other forms of angina pectoris: Secondary | ICD-10-CM

## 2019-08-11 DIAGNOSIS — I10 Essential (primary) hypertension: Secondary | ICD-10-CM

## 2019-08-11 DIAGNOSIS — I255 Ischemic cardiomyopathy: Secondary | ICD-10-CM

## 2019-08-11 MED ORDER — POTASSIUM CHLORIDE ER 10 MEQ PO TBCR: 10.0000 meq | EXTENDED_RELEASE_TABLET | Freq: Every day | ORAL | 3 refills | Status: DC

## 2019-08-11 MED ORDER — FUROSEMIDE 20 MG PO TABS: 20.0000 mg | ORAL_TABLET | Freq: Every day | ORAL | 3 refills | Status: DC

## 2019-08-11 NOTE — Progress Notes (Signed)
Office Visit    Patient Name: Johnny [name] Date of Encounter: 08/11/2019  Primary Care Provider:  Baxter Hire, MD Primary Cardiologist:  No primary care provider on file.  Chief Complaint    Chief Complaint  Patient presents with  . OTHER    6 month f/u no complaints today. Meds reviewed verbally with pt.    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  Past Medical History    Past Medical History:  Diagnosis Date  . Anginal pain ([place])   . Coronary artery disease 1998   CABG x 3  . Depression   . Diabetes mellitus without complication ([place])   . Hyperlipidemia   . Hypertension   . Myocardial infarction ([place])   . SVT (supraventricular tachycardia) ([facility])    Past Surgical History:  Procedure Laterality Date  . CARDIAC CATHETERIZATION Left 12/07/2015   Procedure: Left Heart Cath and Cors/Grafts Angiography;  Surgeon: Corey Skains, MD;  Location: [facility];  Service: Cardiovascular;  Laterality: Left;  . CORONARY ARTERY BYPASS GRAFT  1998   [facility]  . SVT ABLATION N/A 03/13/2018   Procedure: SVT ABLATION;  Surgeon: Constance Haw, MD;  Location: [facility];  Service: Cardiovascular;  Laterality: N/A;    Allergies  Allergies  Allergen Reactions  . Ace Inhibitors Cough and Other (See Comments)    History of Present Illness    Johnny [name] is a 79 y.o. male with PMH as above.    He has history of CAD / ICM s/p CABG in 1998 with cardiac arrest following the procedure and a MI in the 1980s.  11/2015 cath showed patent grafts x3 and is copy and pasted below.  EF 35 to 40% on echocardiogram in 2019.    He has a history of SVT/AVNRT.  He is s/p ablation 03/13/2018 after he presented to the emergency room 01/2018 with palpitations and chest pain.  Vagal maneuvers  were without success.  He was given 6 mg of adenosine, which terminated his SVT.  He subsequently underwent ablation of AVNRT without episodes of recurrent SVT documented since that time.  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  At previous visits, he was uncertain if taking his midodrine or Florinef.  He was previously taking midodrine at 2 PM due to dizziness.  It was noted he is to be on losartan, discontinued due to orthostasis that was debilitating for him.  At his last office visit with his primary cardiologist on 02/10/2019, it was noted that he had chronic shortness of breath.  It was thought that he was deconditioned. It was noted that recommendation for stress test was declined in the past.  He was without symptoms of angina with medications continued and coupon provided for Zetia.   Today, 08/12/2019, he presents to clinic and reports that he is under a significant amount of stress.  He reports that his wife is currently in the hospital and he was recently informed that her prognosis is not good.  He states that he has had declined appetite, which she attributes both to his mood and suspicion that he may be holding onto some fluid.  He has noticed that he has had some increased shortness of breath and  DOE.  He also feels as if he has had some abdominal distention. He noted L Johnny [name] that happened overnight. No erythema, pain, or palpable cord. No reported orthopnea or weight gain.  No chest pain, racing heart rate, or palpitations.  No presyncope or syncope.  He reports medication compliance.  He denies any signs or symptoms of bleeding.  Home Medications    Prior to Admission medications   Medication Sig Start Date End Date Taking? Authorizing Provider  allopurinol (ZYLOPRIM) 100 MG tablet Take 100 mg by mouth daily.   Yes [provider]  aspirin EC 81 MG tablet Take 1 tablet (81 mg total) by  mouth daily. 02/21/18  Yes Minna Merritts, MD  ezetimibe (ZETIA) 10 MG tablet Take 1 tablet (10 mg total) by mouth daily. 02/10/19  Yes Gollan, Kathlene November, MD  glipiZIDE (GLUCOTROL) 10 MG tablet Take 10 mg by mouth 2 (two) times daily before a meal.   Yes [provider]  metFORMIN (GLUCOPHAGE) 1000 MG tablet Take 1,000 mg by mouth 2 (two) times daily with a meal.    Yes [provider]  metoprolol tartrate (LOPRESSOR) 25 MG tablet Take 1 tablet (25 mg total) by mouth 2 (two) times daily. 03/06/18 08/11/19 Yes Merlyn Lot, MD  omeprazole (PRILOSEC) 20 MG capsule Take 20 mg by mouth daily.   Yes [provider]  rOPINIRole (REQUIP) 0.25 MG tablet Take 1 tablet (0.25 mg total) by mouth at bedtime. 09/04/17  Yes [name], MD  sertraline (ZOLOFT) 100 MG tablet Take 100 mg by mouth daily. 11/01/17  Yes [provider]  simvastatin (ZOCOR) 40 MG tablet Take 40 mg by mouth every evening.   Yes [provider]  furosemide (LASIX) 20 MG tablet Take 1 tablet (20 mg total) by mouth daily. 08/11/19 11/09/19  Marrianne Mood D, PA-C  potassium chloride (KLOR-CON) 10 MEQ tablet Take 1 tablet (10 mEq total) by mouth daily. 08/11/19 11/09/19  Marrianne Mood D, PA-C    Review of Systems    He denies chest pain, palpitations, pnd, orthopnea, n, v, dizziness, syncope, weight gain. He reports early satiety, DOE/SOB progressed from previous visits, and abdominal distention/bloating. He notes L Johnny [name] (greater than R) and that occurred overnight.   All other systems reviewed and are otherwise negative except as noted above.  Physical Exam    VS:  BP 132/68 (BP Location: Left Arm, Patient Position: Sitting, Cuff Size: Normal)   Pulse 97   Ht 5\' 9"  (1.753 m)   Wt 177 lb 2 oz (80.3 kg)   SpO2 97%   BMI 26.16 kg/m  , BMI Body mass index is 26.16 kg/m. GEN: Well nourished, well developed, in no acute distress. HEENT: normal. Neck: Supple, JVP ~10cm, no carotid  bruits, or masses. Cardiac: RRR, no murmurs, rubs, or gallops. No clubbing, cyanosis. LLEE moderate and >RLEE.  Radials/DP/PT 2+ and equal bilaterally.  Respiratory:  Bibasilar crackles GI: Firm and slightly distended, nontender, BS + x 4. MS: no deformity or atrophy. Skin: warm and dry, no rash. Neuro:  Strength and sensation are intact. Psych: Normal affect.  Accessory Clinical Findings    ECG personally reviewed by me today - NSR, LAD, LAFB, previous inferior and anterior infarct, 97bpm  - no acute changes.  VITALS Reviewed today   Temp Readings from Last 3 Encounters:  03/13/18 (!) 97.4 F (36.3 C) (Oral)  03/06/18 97.8 F (36.6 C) (Oral)  01/25/18 98.2 F (36.8 C) (Oral)   BP  Readings from Last 3 Encounters:  08/11/19 132/68  02/10/19 133/78  04/15/18 122/74   Pulse Readings from Last 3 Encounters:  08/11/19 97  02/10/19 91  04/15/18 60    Wt Readings from Last 3 Encounters:  08/11/19 177 lb 2 oz (80.3 kg)  02/10/19 174 lb 8 oz (79.2 kg)  04/15/18 176 lb (79.8 kg)     LABS  reviewed today   Lab Results  Component Value Date   WBC 7.2 03/06/2018   HGB 13.0 03/06/2018   HCT 37.0 (L) 03/06/2018   MCV 92.3 03/06/2018   PLT 194 03/06/2018   Lab Results  Component Value Date   CREATININE 0.94 03/06/2018   BUN 16 03/06/2018   NA 137 03/06/2018   K 3.6 03/06/2018   CL 106 03/06/2018   CO2 22 03/06/2018   Lab Results  Component Value Date   ALT 14 03/06/2018   AST 21 03/06/2018   ALKPHOS 105 03/06/2018   BILITOT 0.7 03/06/2018   Lab Results  Component Value Date   CHOL 158 04/25/2012   HDL 77 (H) 04/25/2012   LDLCALC 62 04/25/2012   TRIG 97 04/25/2012    Lab Results  Component Value Date   HGBA1C 9.2 (H) 09/03/2017   Lab Results  Component Value Date   TSH 2.490 09/03/2017     STUDIES/PROCEDURES reviewed today   Echo 08/2017 Left ventricle: The cavity size was mildly dilated. Systolic  function was moderately reduced. The estimated  ejection fraction  was in the range of 35% to 40%. Hypokinesis of the inferior  myocardium. Hypokinesis of the inferoseptal myocardium.  Hypokinesis of the apical myocardium.  - Aortic valve: There was trivial regurgitation. Valve area (VTI):  1.75 cm^2. Valve area (Vmax): 1.62 cm^2. Valve area (Vmean): 1.49  cm^2.  - Mitral valve: There was mild regurgitation.  - Left atrium: The atrium was mildly dilated.   11/2015 LHC  Ramus lesion, 95 %stenosed.  Mid RCA lesion, 100 %stenosed.  SVG graft was visualized by angiography and is small.  Origin to Prox Graft lesion, 50 %stenosed.  SVG graft was visualized by angiography and is normal in caliber and anatomically normal.  Post Atrio lesion, 100 %stenosed.  Ost Cx to Mid Cx lesion, 40 %stenosed.  Dist RCA lesion, 100 %stenosed.  Mid LAD lesion, 85 %stenosed.  LM lesion, 30 %stenosed.  Ost 1st Mrg to 1st Mrg lesion, 65 %stenosed.  LIMA graft was visualized by angiography and is normal in caliber and anatomically normal.   Assessment The patient has had progressive canadian class 3 anginal symptoms with a high probability stress test with risk factors including diabetes, high blood pressure and high cholesterol.  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  He notes decreased appetite and intake with weight today increased 3 pounds from that of his previous visit.  He does appear slightly volume up on exam.  Previous echo as above with EF 35 to 40%. He is not currently on a standing diuretic. Given his history of dizziness in the past, inquired about  presyncope and syncope with patient indicating no further episodes,  even on his BB. Discussed that it is possible that his ablation has improved his sx of dizziness.  Will start on low-dose furosemide 20 mg daily and maintain close follow-up to ensure he does not have any ensuing presyncope or syncope. Previous labs show hypokalemia; therefore, will also start on KCl tab 10 M EQ in addition to his Lasix.  Obtain BMET today and in 1 week with recommendations at that time if indicated.  At RTC, recommend escalation of GDMT as HR/BP and symptoms allow. He will continue to monitor his symptoms at home.    CAD --No s/sx of angina. Concern that SOB may be his anginal equivalent. Given his volume overload today, we will attempt to alleviate sx with low dose lasix as above. However, given the chronic nature of his SOB/DOE and LHC as above, cannot completely rule out SOB/DOE due to ischemia. Previous echo with reduced EF as aboveI. Will defer MPI at this time due to current personal stressors. If ongoing or continue progressive sx of SOB/DOE after diuresis as above, reassess if agreeable to MPI. He has declined MPI in the past. Continue current medications, including ASA and BB. Escalate GDMT as tolerated as above. Activity recommended as tolerated, given suspect at least some element of deconditioning.   SVT/AVNRT s/p 02/2018 ablation --S/p ablation without recurrence of SVT/AVNRT. No further dizziness reported. Consider that this ablation may have improved his dizziness. Continue BB, given no recent dizziness.  HLD --Continue statin and Zetia.  HTN --Current BP borderline with goal BP 130/80 but with consideration of current stressors. Reassess at RTC.  History of dizziness, orthostatic hypotension --As above, no recent sx. Consider that these may have improved since his ablation. Continue to monitor closely with start of Lasix.   LLE edema (greater than RLE edema) --No erythema or palpable cord. His HR is  97bpm wand he is 97% ORA. Low concern for DVT at this time. He will let us know if new sx or if does not improve with lasix. Reassess at RTC.   DM2 --Glycemic control recommended.  Medication changes: Start Lasix 20mg  and KCl tab 10 mEq Labs ordered: BMET today and in 1 week Studies / Imaging ordered: Deferred Future considerations: MPI, escalation of GDMT Disposition: RTC in 1-2 weeks  Arvil Chaco, PA-C 08/11/2019

## 2019-08-11 NOTE — Patient Instructions (Signed)
Medication Instructions:   Your physician has recommended you make the following change in your medication:   1.  START: Lasix (Furosemide): Take 1 tablet (20 mg total) by mouth daily. 2.  START: Potassium Chloride (KLOR-CON): Take 1 tablet (10 mEq total) by mouth daily  **If you become dizzy while taking your Lasix, please stop taking it and notify our office.  *If you need a refill on your cardiac medications before your next appointment, please call your pharmacy*   Lab Work:  Your physician recommends that you return for lab work in 1 week.  If you have labs (blood work) drawn today and your tests are completely normal, you will receive your results only by: Marland Kitchen MyChart Message (if you have MyChart) OR . A paper copy in the mail If you have any lab test that is abnormal or we need to change your treatment, we will call you to review the results.   Testing/Procedures: None Ordered   Follow-Up: At [facility], you and your health needs are our priority.  As part of our continuing mission to provide you with exceptional heart care, we have created designated Provider Care Teams.  These Care Teams include your primary Cardiologist (physician) and Advanced Practice Providers (APPs -  Physician Assistants and Nurse Practitioners) who all work together to provide you with the care you need, when you need it.  We recommend signing up for the patient portal called "MyChart".  Sign up information is provided on this After Visit Summary.  MyChart is used to connect with patients for Virtual Visits (Telemedicine).  Patients are able to view lab/test results, encounter notes, upcoming appointments, etc.  Non-urgent messages can be sent to your provider as well.   To learn more about what you can do with MyChart, go to [email or web address].    Your next appointment:   1 week(s)  The format for your next appointment:   In Person  Provider:    You may see No primary care provider on  file. or one of the following Advanced Practice Providers on your designated Care Team:    Murray Hodgkins, NP  Christell Faith, PA-C  Marrianne Mood, PA-C    Other Instructions Please notify our office for any symptoms you may be feeling.

## 2019-08-12 LAB — BASIC METABOLIC PANEL
BUN/Creatinine Ratio: 14 (ref 10–24)
BUN: 18 mg/dL (ref 8–27)
CO2: 19 mmol/L — ABNORMAL LOW (ref 20–29)
Calcium: 9.4 mg/dL (ref 8.6–10.2)
Chloride: 100 mmol/L (ref 96–106)
Creatinine, Ser: 1.28 mg/dL — ABNORMAL HIGH (ref 0.76–1.27)
GFR calc Af Amer: 61 mL/min/{1.73_m2} (ref >59)
GFR calc non Af Amer: 53 mL/min/{1.73_m2} — ABNORMAL LOW (ref >59)
Glucose: 274 mg/dL — ABNORMAL HIGH (ref 65–99)
Potassium: 4.8 mmol/L (ref 3.5–5.2)
Sodium: 136 mmol/L (ref 134–144)

## 2019-08-15 ENCOUNTER — Telehealth: Payer: Self-pay

## 2019-08-15 DIAGNOSIS — I25708 Atherosclerosis of coronary artery bypass graft(s), unspecified, with other forms of angina pectoris: Secondary | ICD-10-CM

## 2019-08-15 NOTE — Telephone Encounter (Signed)
Call to patient to review labs.    Pt verbalized understanding and has no further questions at this time.    Advised pt to call for any further questions or concerns.  Repeat labs in the medical mall in 1 week.

## 2019-08-15 NOTE — Telephone Encounter (Signed)
-----   Message from Arvil Chaco, PA-C sent at 08/14/2019  1:20 AM EDT ----- Please let Mr. Burzynski know that his baseline labs (taken on Monday 6/21 and before the start of his lasix) show a slight bump in his renal function from 1 year ago, which could be due to his volume overload noted at clinic this week.   His sugars (274) were also fairly high, which can influence kidney health over time as well. He should be sure to talk with his PCP to make sure he is on the right medications to control his diabetes.   Given his history of dizziness in the past, if he starts to feel dizzy, he should hold his lasix and call our office for further recommendations (ex: reduced dose versus stopping it). Please ask that he monitor his weight, BP, and HR at home as well, so we can track his progress with diuresis.  His baseline potassium (before start of lasix) was actually on the higher side (unlike previous labs). He should stop the potassium supplementation /  Kcl tab 76mEq -  hold on to this bottle, however, as he may need to restart it depending on how his labs look after 1 week of lasix.  1) Stop Kcl tab 7mEq for now - hold on to the bottle as he may need it after starting the lasix. 2) Continue lasix 20mg  daily. Call the office if dizzy, or if he continues to feel volume overloaded. 3) Repeat BMET in 1 week 4) Talk with PCP about sugars 5) Monitor BP/HR/home weights if possible

## 2019-08-19 ENCOUNTER — Other Ambulatory Visit
Admission: RE | Admit: 2019-08-19 | Discharge: 2019-08-19 | Disposition: A | Discharge status: Home / Self Care | Payer: Medicare Other | Attending: Physician Assistant | Admitting: Physician Assistant

## 2019-08-19 DIAGNOSIS — I25708 Atherosclerosis of coronary artery bypass graft(s), unspecified, with other forms of angina pectoris: Secondary | ICD-10-CM | POA: Insufficient documentation

## 2019-08-19 LAB — BASIC METABOLIC PANEL
Anion gap: 12 (ref 5–15)
BUN: 24 mg/dL — ABNORMAL HIGH (ref 8–23)
CO2: 25 mmol/L (ref 22–32)
Calcium: 9.1 mg/dL (ref 8.9–10.3)
Chloride: 100 mmol/L (ref 98–111)
Creatinine, Ser: 1.41 mg/dL — ABNORMAL HIGH (ref 0.61–1.24)
GFR calc Af Amer: 54 mL/min — ABNORMAL LOW (ref >60)
GFR calc non Af Amer: 46 mL/min — ABNORMAL LOW (ref >60)
Glucose, Bld: 196 mg/dL — ABNORMAL HIGH (ref 70–99)
Potassium: 3.5 mmol/L (ref 3.5–5.1)
Sodium: 137 mmol/L (ref 135–145)

## 2019-08-22 ENCOUNTER — Ambulatory Visit: Payer: Medicare Other | Admitting: Cardiology

## 2019-08-22 ENCOUNTER — Other Ambulatory Visit: Payer: Self-pay

## 2019-08-22 ENCOUNTER — Encounter: Payer: Self-pay | Admitting: Cardiology

## 2019-08-22 VITALS — BP 110/62 | HR 81 | Ht 69.0 in | Wt 174.0 lb

## 2019-08-22 DIAGNOSIS — I2510 Atherosclerotic heart disease of native coronary artery without angina pectoris: Secondary | ICD-10-CM

## 2019-08-22 DIAGNOSIS — R600 Localized edema: Secondary | ICD-10-CM | POA: Diagnosis not present

## 2019-08-22 DIAGNOSIS — I255 Ischemic cardiomyopathy: Secondary | ICD-10-CM | POA: Diagnosis not present

## 2019-08-22 MED ORDER — FUROSEMIDE 20 MG PO TABS
20.0000 mg | ORAL_TABLET | ORAL | 3 refills | Status: DC | PRN
Start: 2019-08-22 — End: 2020-01-26

## 2019-08-22 NOTE — Patient Instructions (Signed)
Medication Instructions:   CHANGE your Lasix (Furosemide): Take 1 tablet (20 mg total) by mouth only when needed for lower extremity swelling.  *If you need a refill on your cardiac medications before your next appointment, please call your pharmacy*   Lab Work: Your physician recommends that you have lab work today Artist) If you have labs (blood work) drawn today and your tests are completely normal, you will receive your results only by:  MyChart Message (if you have MyChart) OR  A paper copy in the mail If you have any lab test that is abnormal or we need to change your treatment, we will call you to review the results.   Testing/Procedures: None Ordered   Follow-Up: At [facility], you and your health needs are our priority.  As part of our continuing mission to provide you with exceptional heart care, we have created designated Provider Care Teams.  These Care Teams include your primary Cardiologist (physician) and Advanced Practice Providers (APPs -  Physician Assistants and Nurse Practitioners) who all work together to provide you with the care you need, when you need it.  We recommend signing up for the patient portal called "MyChart".  Sign up information is provided on this After Visit Summary.  MyChart is used to connect with patients for Virtual Visits (Telemedicine).  Patients are able to view lab/test results, encounter notes, upcoming appointments, etc.  Non-urgent messages can be sent to your provider as well.   To learn more about what you can do with MyChart, go to [email or web address].    Your next appointment:   In December 2021 for your annual with Dr. Rockey Situ   The format for your next appointment:   In Person  Provider:   Dr. Rockey Situ   Other Instructions N/A

## 2019-08-22 NOTE — Progress Notes (Signed)
Cardiology Office Note:    Date:  08/22/2019   ID:  Johnny [name], DOB September 15, 1940, MRN [id number]  PCP:  Baxter Hire, MD  [facility] Cardiologist:  Ida Rogue, MD  [place] Electrophysiologist:  None   Referring MD: Baxter Hire, MD   Chief Complaint  Patient presents with  . other    Pt is concerned w/ Metoprolol. Meds verbally reviewed w/ pt.     History of Present Illness:    Johnny [name] is a 79 y.o. male with a hx of CAD/ CABG x3, hypertension, hyperlipidemia, diabetes, SVT status post ablation 02/2018, ischemic cardiomyopathy, last EF 35 to 40% who presents for follow-up.  Patient has a history of chronic shortness of breath.  He was seen about a week ago by PA due to edema. Lasix was started after last visit.  BMP was ordered and results showed worsening creatinine was also 1.28, last value 3 days ago was 1.4.  Patient was advised to stop Lasix.  He states that Lasix helped with his edema and he currently has no edema.  He otherwise feels well and has no concerns at this time.  Losartan was previously stopped due to symptoms of dizziness and orthostasis.  Past Medical History:  Diagnosis Date  . Anginal pain ([place])   . Coronary artery disease 1998   CABG x 3  . Depression   . Diabetes mellitus without complication ([place])   . Hyperlipidemia   . Hypertension   . Myocardial infarction ([place])   . SVT (supraventricular tachycardia) (HCC)     Past Surgical History:  Procedure Laterality Date  . CARDIAC CATHETERIZATION Left 12/07/2015   Procedure: Left Heart Cath and Cors/Grafts Angiography;  Surgeon: Corey Skains, MD;  Location: [place] [facility];  Service: Cardiovascular;  Laterality: Left;  . CORONARY ARTERY BYPASS GRAFT  1998   [facility]  . SVT ABLATION N/A 03/13/2018   Procedure: SVT ABLATION;  Surgeon: Constance Haw, MD;  Location: [place] [facility];  Service: Cardiovascular;  Laterality: N/A;    Current Medications: Current Meds    Medication Sig  . allopurinol (ZYLOPRIM) 100 MG tablet Take 100 mg by mouth daily.  Marland Kitchen aspirin EC 81 MG tablet Take 1 tablet (81 mg total) by mouth daily.  Marland Kitchen ezetimibe (ZETIA) 10 MG tablet Take 1 tablet (10 mg total) by mouth daily.  . furosemide (LASIX) 20 MG tablet Take 1 tablet (20 mg total) by mouth as needed. For lower extremity edema  . glipiZIDE (GLUCOTROL) 10 MG tablet Take 10 mg by mouth 2 (two) times daily before a meal.  . metFORMIN (GLUCOPHAGE) 1000 MG tablet Take 1,000 mg by mouth 2 (two) times daily with a meal.   . omeprazole (PRILOSEC) 20 MG capsule Take 20 mg by mouth daily.  Marland Kitchen rOPINIRole (REQUIP) 0.25 MG tablet Take 1 tablet (0.25 mg total) by mouth at bedtime.  . sertraline (ZOLOFT) 100 MG tablet Take 100 mg by mouth daily.  . simvastatin (ZOCOR) 40 MG tablet Take 40 mg by mouth every evening.  . [DISCONTINUED] furosemide (LASIX) 20 MG tablet Take 1 tablet (20 mg total) by mouth daily.     Allergies:   Ace inhibitors   Social History   Socioeconomic History  . Marital status: Married    Spouse name: Not on file  . Number of children: Not on file  . Years of education: Not on file  . Highest education level: Not on file  Occupational History  .  Not on file  Tobacco Use  . Smoking status: Former Smoker    Packs/day: 1.50    Years: 25.00    Pack years: 37.50    Types: Cigarettes    Quit date: 03/24/1978    Years since quitting: 41.4  . Smokeless tobacco: Never Used  Vaping Use  . Vaping Use: Never used  Substance and Sexual Activity  . Alcohol use: No  . Drug use: No  . Sexual activity: Not on file  Other Topics Concern  . Not on file  Social History Narrative  . Not on file   Social Determinants of Health   Financial Resource Strain:   . Difficulty of Paying Living Expenses:   Food Insecurity:   . Worried About Charity fundraiser in the Last Year:   . Arboriculturist in the Last Year:   Transportation Needs:   . Film/video editor (Medical):    Marland Kitchen Lack of Transportation (Non-Medical):   Physical Activity:   . Days of Exercise per Week:   . Minutes of Exercise per Session:   Stress:   . Feeling of Stress :   Social Connections:   . Frequency of Communication with Friends and Family:   . Frequency of Social Gatherings with Friends and Family:   . Attends Religious Services:   . Active Member of Clubs or Organizations:   . Attends Archivist Meetings:   Marland Kitchen Marital Status:      Family History: The patient's family history includes Heart attack in his brother, brother, father, sister, sister, and sister.  ROS:   Please see the history of present illness.     All other systems reviewed and are negative.  EKGs/Labs/Other Studies Reviewed:    The following studies were reviewed today:   EKG:  EKG is  ordered today.  The ekg ordered today demonstrates normal sinus rhythm, anterior infarct  Recent Labs: 08/19/2019: BUN 24; Creatinine, Ser 1.41; Potassium 3.5; Sodium 137  Recent Lipid Panel    Component Value Date/Time   CHOL 158 04/25/2012 1011   TRIG 97 04/25/2012 1011   HDL 77 (H) 04/25/2012 1011   VLDL 19 04/25/2012 1011   LDLCALC 62 04/25/2012 1011    Physical Exam:    VS:  BP 110/62 (BP Location: Left Arm, Patient Position: Sitting, Cuff Size: Normal)   Pulse 81   Ht 5\' 9"  (1.753 m)   Wt 174 lb (78.9 kg)   SpO2 96%   BMI 25.70 kg/m     Wt Readings from Last 3 Encounters:  08/22/19 174 lb (78.9 kg)  08/11/19 177 lb 2 oz (80.3 kg)  02/10/19 174 lb 8 oz (79.2 kg)     GEN:  Well nourished, well developed in no acute distress HEENT: Normal NECK: No JVD; No carotid bruits LYMPHATICS: No lymphadenopathy CARDIAC: RRR, no murmurs, rubs, gallops RESPIRATORY:  Clear to auscultation without rales, wheezing or rhonchi  ABDOMEN: Soft, non-tender, non-distended MUSCULOSKELETAL:  No edema; No deformity  SKIN: Warm and dry NEUROLOGIC:  Alert and oriented x 3 PSYCHIATRIC:  Normal affect   ASSESSMENT:     1. Leg edema   2. Cardiomyopathy, ischemic   3. Coronary artery disease involving native coronary artery of native heart without angina pectoris    PLAN:    In order of problems listed above:  1. Patient with history of lower extremity edema.  Currently resolved.  Creatinine noted to be elevated to 1.4 on last lab work.  Stop Lasix daily, take only as needed for edema.  Check BMP today to make sure trend of creatinine is downwards 2. History of ischemic cardiomyopathy, last EF 35 to 40%.  Previously not tolerant to ARB's due to orthostasis.  Continue beta-blocker as prescribed. 3. History of CAD/CABG, continue aspirin, Zetia.  Keep follow-up appointments as scheduled with Dr. Rockey Situ    Medication Adjustments/Labs and Tests Ordered: Current medicines are reviewed at length with the patient today.  Concerns regarding medicines are outlined above.  Orders Placed This Encounter  Procedures  . Basic Metabolic Panel (BMET)  . EKG 12-Lead   Meds ordered this encounter  Medications  . furosemide (LASIX) 20 MG tablet    Sig: Take 1 tablet (20 mg total) by mouth as needed. For lower extremity edema    Dispense:  30 tablet    Refill:  3    Patient Instructions  Medication Instructions:   CHANGE your Lasix (Furosemide): Take 1 tablet (20 mg total) by mouth only when needed for lower extremity swelling.  *If you need a refill on your cardiac medications before your next appointment, please call your pharmacy*   Lab Work: Your physician recommends that you have lab work today Artist) If you have labs (blood work) drawn today and your tests are completely normal, you will receive your results only by: Marland Kitchen MyChart Message (if you have MyChart) OR . A paper copy in the mail If you have any lab test that is abnormal or we need to change your treatment, we will call you to review the results.   Testing/Procedures: None Ordered   Follow-Up: At [facility], you and your health needs  are our priority.  As part of our continuing mission to provide you with exceptional heart care, we have created designated Provider Care Teams.  These Care Teams include your primary Cardiologist (physician) and Advanced Practice Providers (APPs -  Physician Assistants and Nurse Practitioners) who all work together to provide you with the care you need, when you need it.  We recommend signing up for the patient portal called "MyChart".  Sign up information is provided on this After Visit Summary.  MyChart is used to connect with patients for Virtual Visits (Telemedicine).  Patients are able to view lab/test results, encounter notes, upcoming appointments, etc.  Non-urgent messages can be sent to your provider as well.   To learn more about what you can do with MyChart, go to [email or web address].    Your next appointment:   In December 2021 for your annual with Dr. Rockey Situ   The format for your next appointment:   In Person  Provider:   Dr. Rockey Situ   Other Instructions N/A     Signed, Kate Sable, MD  08/22/2019 12:36 PM    [place]

## 2019-08-23 LAB — BASIC METABOLIC PANEL
BUN/Creatinine Ratio: 17 (ref 10–24)
BUN: 20 mg/dL (ref 8–27)
CO2: 22 mmol/L (ref 20–29)
Calcium: 9.4 mg/dL (ref 8.6–10.2)
Chloride: 102 mmol/L (ref 96–106)
Creatinine, Ser: 1.2 mg/dL (ref 0.76–1.27)
GFR calc Af Amer: 65 mL/min/{1.73_m2} (ref >59)
GFR calc non Af Amer: 56 mL/min/{1.73_m2} — ABNORMAL LOW (ref >59)
Glucose: 242 mg/dL — ABNORMAL HIGH (ref 65–99)
Potassium: 4.4 mmol/L (ref 3.5–5.2)
Sodium: 138 mmol/L (ref 134–144)

## 2020-01-22 NOTE — Progress Notes (Signed)
Cardiology Office Note  Date:  01/26/2020   ID:  Johnny [name], DOB 1941-01-08, MRN [id number]  PCP:  Baxter Hire, MD   Chief Complaint  Patient presents with  . Other    follow up from July - Meds reviewed verbally with patient.     HPI:  Johnny [name] is a 79 y.o. male with  MI 101s CAD, CABG 1998, cardiac arrest following the procedure Ischemic cardiomyopathy ejection fraction 35 to 40% on echocardiogram 2019 Cath 11/2015 patent grafts x3 hyperlipidemia  SVT, adenosine 08/2017 Had SVT x 3 total (dec 2018, 08/2017 ( needed adenosine), 11/2017) Diabetes Chronic shortness of breath with overexertion Presenting for follow-up of his coronary disease, shortness of breath, SVT, prior ablation  Last seen in the clinic by one of our providers Jun 2021  In the emergency room Jan 2020 for SVT and Dec 2019 Denies any recurrence of his SVT since ablation  Last echo 2019 Ejection fraction 35 to 40%  Wife died last year, Daughter lives next door "lost a lot of strength in legs", has been less active Uses a cane, also owns a walker, uses the walker in the house No recent falls  Chronic SOB  Not on midodrine/florinef Some orthostasis  Drinks water, 3 bottles a day  Labs reviewed with him on today's visit LDL 62  EKG personally reviewed by myself on todays visit Shows normal sinus rhythm rate 85 bpm poor R wave progression through the anterior leads, unable to exclude old inferior MI  Other past medical history reviewed emergency room 01/25/2018 with palpitations.    associated with chest pain with radiation to the jaw.   His heart rate was in the 160s.   vagal maneuvers without success.  given 6 mg of adenosine which terminated his SVT.    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  no further episodes of SVT since ablation   telemetry strips from July 2019 documenting narrow complex tachycardia rate 169 bpm that broke with adenosine   hospitalization   new onset of palpitations chest pain and neck fluttering 09/03/2017  suddenly when he was at home.  seen by the EMS.   Possible SVT,  given adenosine  resolved within 3 to 5 minutes.     echocardiogram July 2019  LV dysfunction of 35 to 40% cardiac catheterization which showed EF of 40% in 2017  Remote weight loss from pancreas Chronic dizziness, since that time Used to be on losartan, medication was weaned off but continues to have orthostasis that is debilitating Feels it is affecting his life and making his balance worse  Prior cardiac catheterization results from 2017  Ramus lesion, 95 %stenosed.  Mid RCA lesion, 100 %stenosed.  SVG graft was visualized by angiography and is small.  Origin to Prox Graft lesion, 50 %stenosed.  SVG graft was visualized by angiography and is normal in caliber and anatomically normal.  Post Atrio lesion, 100 %stenosed.  Ost Cx to Mid Cx lesion, 40 %stenosed.  Dist RCA lesion, 100 %stenosed.  Mid LAD lesion, 85 %stenosed.  LM lesion, 30 %stenosed.  Ost 1st Mrg to 1st Mrg lesion, 65 %stenosed.  LIMA graft was visualized by angiography and is normal in caliber and anatomically normal.    PMH:   has a past medical history of Anginal pain ([place]), Coronary artery disease (1998), Depression, Diabetes mellitus without complication ([place]), Hyperlipidemia, Hypertension, Myocardial infarction ([place]), and SVT (supraventricular tachycardia) ([place]).  PSH:    Past Surgical  History:  Procedure Laterality Date  . CARDIAC CATHETERIZATION Left 12/07/2015   Procedure: Left Heart Cath and Cors/Grafts Angiography;  Surgeon: Corey Skains, MD;  Location: [place] [facility];  Service: Cardiovascular;  Laterality: Left;  . CORONARY ARTERY BYPASS GRAFT  1998   [facility]  . SVT ABLATION N/A 03/13/2018   Procedure: SVT ABLATION;  Surgeon: Constance Haw, MD;  Location: [place] [facility];  Service: Cardiovascular;  Laterality: N/A;    Current Outpatient  Medications  Medication Sig Dispense Refill  . allopurinol (ZYLOPRIM) 100 MG tablet Take 100 mg by mouth daily.    Marland Kitchen aspirin EC 81 MG tablet Take 1 tablet (81 mg total) by mouth daily. 90 tablet 0  . ezetimibe (ZETIA) 10 MG tablet Take 1 tablet (10 mg total) by mouth daily. 90 tablet 3  . glipiZIDE (GLUCOTROL) 10 MG tablet Take 10 mg by mouth 2 (two) times daily before a meal.    . metFORMIN (GLUCOPHAGE) 1000 MG tablet Take 1,000 mg by mouth 2 (two) times daily with a meal.     . omeprazole (PRILOSEC) 20 MG capsule Take 20 mg by mouth daily.    Marland Kitchen rOPINIRole (REQUIP) 0.25 MG tablet Take 1 tablet (0.25 mg total) by mouth at bedtime. 30 tablet 1  . sertraline (ZOLOFT) 100 MG tablet Take 100 mg by mouth daily.    . simvastatin (ZOCOR) 40 MG tablet Take 40 mg by mouth every evening.     No current facility-administered medications for this visit.    Allergies:   Ace inhibitors   Social History:  The patient  reports that he quit smoking about 41 years ago. His smoking use included cigarettes. He has a 37.50 pack-year smoking history. He has never used smokeless tobacco. He reports that he does not drink alcohol and does not use drugs.   Family History:   family history includes Heart attack in his brother, brother, father, sister, sister, and sister.    Review of Systems: Review of Systems  Constitutional: Negative.   HENT: Negative.   Respiratory: Positive for shortness of breath.   Cardiovascular: Negative.   Gastrointestinal: Negative.   Musculoskeletal: Negative.   Neurological: Negative.   Psychiatric/Behavioral: Negative.   All other systems reviewed and are negative.   PHYSICAL EXAM: VS:  BP 110/60 (BP Location: Left Arm, Patient Position: Sitting, Cuff Size: Normal)   Pulse 85   Ht 5\' 10"  (1.778 m)   Wt 175 lb (79.4 kg)   SpO2 95%   BMI 25.11 kg/m  , BMI Body mass index is 25.11 kg/m. Constitutional:  oriented to person, place, and time. No distress.  HENT:  Head:  Grossly normal Eyes:  no discharge. No scleral icterus.  Neck: No JVD, no carotid bruits  Cardiovascular: Regular rate and rhythm, no murmurs appreciated Pulmonary/Chest: Clear to auscultation bilaterally, no wheezes or rails Abdominal: Soft.  no distension.  no tenderness.  Musculoskeletal: Normal range of motion Neurological:  normal muscle tone. Coordination normal. No atrophy Skin: Skin warm and dry Psychiatric: normal affect, pleasant   Recent Labs: 08/22/2019: BUN 20; Creatinine, Ser 1.20; Potassium 4.4; Sodium 138    Lipid Panel Lab Results  Component Value Date   CHOL 158 04/25/2012   HDL 77 (H) 04/25/2012   LDLCALC 62 04/25/2012   TRIG 97 04/25/2012      Wt Readings from Last 3 Encounters:  01/26/20 175 lb (79.4 kg)  08/22/19 174 lb (78.9 kg)  08/11/19 177 lb 2 oz (  80.3 kg)     ASSESSMENT AND PLAN:  Coronary artery disease of native artery of native heart with stable angina pectoris (HCC) -  Currently with no symptoms of angina. No further workup at this time. Continue current medication regimen.  SVT (supraventricular tachycardia) ([place]) - Plan: EKG 12-Lead Prior ablation, successful,  No arrhythmia  Shortness of breath Deconditioned, no regular exercise program We did discuss stress test in the past, he declined stable  Orthostasis Not on meds Encouraged fluids Not on midodrine or Florinef  Mixed hyperlipidemia Continue statin zetia  Ischemic cardiomyopathy Ejection fraction 35 to 40%, euvolemic In 2019   Total encounter time more than 25 minutes  Greater than 50% was spent in counseling and coordination of care with the patient    Orders Placed This Encounter  Procedures  . EKG 12-Lead     Signed, Esmond Plants, M.D., Ph.D. 01/26/2020  [place], [place]

## 2020-01-26 ENCOUNTER — Ambulatory Visit: Payer: Medicare Other | Admitting: Cardiovascular Disease

## 2020-01-26 ENCOUNTER — Encounter: Payer: Self-pay | Admitting: Cardiovascular Disease

## 2020-01-26 ENCOUNTER — Other Ambulatory Visit: Payer: Self-pay

## 2020-01-26 VITALS — BP 110/60 | HR 85 | Ht 70.0 in | Wt 175.0 lb

## 2020-01-26 DIAGNOSIS — I471 Supraventricular tachycardia, unspecified: Secondary | ICD-10-CM

## 2020-01-26 DIAGNOSIS — R0602 Shortness of breath: Secondary | ICD-10-CM

## 2020-01-26 DIAGNOSIS — I255 Ischemic cardiomyopathy: Secondary | ICD-10-CM

## 2020-01-26 DIAGNOSIS — I25708 Atherosclerosis of coronary artery bypass graft(s), unspecified, with other forms of angina pectoris: Secondary | ICD-10-CM

## 2020-01-26 DIAGNOSIS — I10 Essential (primary) hypertension: Secondary | ICD-10-CM

## 2020-01-26 DIAGNOSIS — I4719 Other supraventricular tachycardia: Secondary | ICD-10-CM

## 2020-01-26 NOTE — Patient Instructions (Signed)
Medication Instructions:  No changes  If you need a refill on your cardiac medications before your next appointment, please call your pharmacy.    Lab work: No new labs needed   If you have labs (blood work) drawn today and your tests are completely normal, you will receive your results only by: . MyChart Message (if you have MyChart) OR . A paper copy in the mail If you have any lab test that is abnormal or we need to change your treatment, we will call you to review the results.   Testing/Procedures: No new testing needed   Follow-Up: At [facility], you and your health needs are our priority.  As part of our continuing mission to provide you with exceptional heart care, we have created designated Provider Care Teams.  These Care Teams include your primary Cardiologist (physician) and Advanced Practice Providers (APPs -  Physician Assistants and Nurse Practitioners) who all work together to provide you with the care you need, when you need it.  . You will need a follow up appointment in 12 months  . Providers on your designated Care Team:   . Christopher Berge, NP . Ryan Dunn, PA-C . Jacquelyn Visser, PA-C  Any Other Special Instructions Will Be Listed Below (If Applicable).  COVID-19 Vaccine Information can be found at: [email or web address] For questions related to vaccine distribution or appointments, please email [email or web address] or call [phone].     

## 2020-08-18 ENCOUNTER — Encounter: Payer: Self-pay | Admitting: Ophthalmology

## 2020-08-30 NOTE — Anesthesia Preprocedure Evaluation (Addendum)
Anesthesia Evaluation  Patient identified by MRN, date of birth, ID band Patient awake    Reviewed: Allergy & Precautions, NPO status , Patient's Chart, lab work & pertinent test results  History of Anesthesia Complications Negative for: history of anesthetic complications  Airway Mallampati: I   Neck ROM: Full    Dental  (+) Edentulous Lower, Edentulous Upper   Pulmonary former smoker,    Pulmonary exam normal breath sounds clear to auscultation       Cardiovascular hypertension, + CAD (s/p MI and CABG)  Normal cardiovascular exam+ dysrhythmias (SVT s/p ablation)  Rhythm:Regular Rate:Normal  Ischemic cardiomyopathy, EF 35-40%   Neuro/Psych PSYCHIATRIC DISORDERS Depression    GI/Hepatic negative GI ROS,   Endo/Other  diabetes, Type 2  Renal/GU Renal disease (stage III CKD)     Musculoskeletal   Abdominal   Peds  Hematology   Anesthesia Other Findings   Reproductive/Obstetrics                            Anesthesia Physical Anesthesia Plan  ASA: 3  Anesthesia Plan: MAC   Post-op Pain Management:    Induction: Intravenous  PONV Risk Score and Plan: 1 and TIVA, Midazolam and Treatment may vary due to age or medical condition  Airway Management Planned: Nasal Cannula  Additional Equipment:   Intra-op Plan:   Post-operative Plan:   Informed Consent: I have reviewed the patients History and Physical, chart, labs and discussed the procedure including the risks, benefits and alternatives for the proposed anesthesia with the patient or authorized representative who has indicated his/her understanding and acceptance.   Patient has DNR.  Discussed DNR with patient and Continue DNR.     Plan Discussed with: CRNA  Anesthesia Plan Comments:        Anesthesia Quick Evaluation

## 2020-08-31 ENCOUNTER — Encounter
Admission: RE | Disposition: A | Discharge status: Home / Self Care | Payer: Self-pay | Source: Home / Self Care | Attending: Ophthalmology

## 2020-08-31 ENCOUNTER — Ambulatory Visit
Admission: RE | Admit: 2020-08-31 | Discharge: 2020-08-31 | Disposition: A | Discharge status: Home / Self Care | Payer: Medicare Other | Attending: Ophthalmology | Admitting: Ophthalmology

## 2020-08-31 ENCOUNTER — Ambulatory Visit: Payer: Medicare Other | Admitting: Anesthesiology

## 2020-08-31 ENCOUNTER — Other Ambulatory Visit: Payer: Self-pay

## 2020-08-31 ENCOUNTER — Encounter: Payer: Self-pay | Admitting: Ophthalmology

## 2020-08-31 DIAGNOSIS — H2512 Age-related nuclear cataract, left eye: Secondary | ICD-10-CM | POA: Diagnosis not present

## 2020-08-31 DIAGNOSIS — Z951 Presence of aortocoronary bypass graft: Secondary | ICD-10-CM | POA: Diagnosis not present

## 2020-08-31 DIAGNOSIS — Z79899 Other long term (current) drug therapy: Secondary | ICD-10-CM | POA: Insufficient documentation

## 2020-08-31 DIAGNOSIS — I129 Hypertensive chronic kidney disease with stage 1 through stage 4 chronic kidney disease, or unspecified chronic kidney disease: Secondary | ICD-10-CM | POA: Diagnosis not present

## 2020-08-31 DIAGNOSIS — Z8249 Family history of ischemic heart disease and other diseases of the circulatory system: Secondary | ICD-10-CM | POA: Insufficient documentation

## 2020-08-31 DIAGNOSIS — Z87891 Personal history of nicotine dependence: Secondary | ICD-10-CM | POA: Insufficient documentation

## 2020-08-31 DIAGNOSIS — I255 Ischemic cardiomyopathy: Secondary | ICD-10-CM | POA: Diagnosis not present

## 2020-08-31 DIAGNOSIS — E1122 Type 2 diabetes mellitus with diabetic chronic kidney disease: Secondary | ICD-10-CM | POA: Diagnosis not present

## 2020-08-31 DIAGNOSIS — Z888 Allergy status to other drugs, medicaments and biological substances status: Secondary | ICD-10-CM | POA: Insufficient documentation

## 2020-08-31 DIAGNOSIS — N183 Chronic kidney disease, stage 3 unspecified: Secondary | ICD-10-CM | POA: Insufficient documentation

## 2020-08-31 DIAGNOSIS — E1136 Type 2 diabetes mellitus with diabetic cataract: Secondary | ICD-10-CM | POA: Insufficient documentation

## 2020-08-31 DIAGNOSIS — Z7984 Long term (current) use of oral hypoglycemic drugs: Secondary | ICD-10-CM | POA: Diagnosis not present

## 2020-08-31 DIAGNOSIS — Z7982 Long term (current) use of aspirin: Secondary | ICD-10-CM | POA: Diagnosis not present

## 2020-08-31 HISTORY — DX: Presence of dental prosthetic device (complete) (partial): Z97.2

## 2020-08-31 HISTORY — PX: CATARACT EXTRACTION W/PHACO: SHX586

## 2020-08-31 LAB — GLUCOSE, CAPILLARY
Glucose-Capillary: 168 mg/dL — ABNORMAL HIGH (ref 70–99)
Glucose-Capillary: 150 mg/dL — ABNORMAL HIGH (ref 70–99)

## 2020-08-31 SURGERY — PHACOEMULSIFICATION, CATARACT, WITH IOL INSERTION
Anesthesia: Monitor Anesthesia Care | Site: Eye | Laterality: Left

## 2020-08-31 MED ORDER — DEXAMETHASONE 0.4 MG OP INST
VAGINAL_INSERT | OPHTHALMIC | Status: DC | PRN
  Administered 2020-08-31: 0.4 mg via OPHTHALMIC

## 2020-08-31 MED ORDER — LIDOCAINE HCL (PF) 2 % IJ SOLN
INTRAOCULAR | Status: DC | PRN
  Administered 2020-08-31: 2 mL

## 2020-08-31 MED ORDER — MOXIFLOXACIN HCL 0.5 % OP SOLN
OPHTHALMIC | Status: DC | PRN
  Administered 2020-08-31: 0.2 mL via OPHTHALMIC

## 2020-08-31 MED ORDER — CYCLOPENTOLATE HCL 2 % OP SOLN
1.0000 [drp] | OPHTHALMIC | Status: DC | PRN
  Administered 2020-08-31 (×3): 1 [drp] via OPHTHALMIC

## 2020-08-31 MED ORDER — SIGHTPATH DOSE#1 NA CHONDROIT SULF-NA HYALURON 40-17 MG/ML IO SOLN
INTRAOCULAR | Status: DC | PRN
  Administered 2020-08-31: 1 mL via INTRAOCULAR

## 2020-08-31 MED ORDER — MIDAZOLAM HCL 2 MG/2ML IJ SOLN
INTRAMUSCULAR | Status: DC | PRN
  Administered 2020-08-31: 1 mg via INTRAVENOUS

## 2020-08-31 MED ORDER — SIGHTPATH DOSE#1 BSS IO SOLN
INTRAOCULAR | Status: DC | PRN
  Administered 2020-08-31: 55 mL via OPHTHALMIC

## 2020-08-31 MED ORDER — FENTANYL CITRATE (PF) 100 MCG/2ML IJ SOLN
INTRAMUSCULAR | Status: DC | PRN
  Administered 2020-08-31: 50 ug via INTRAVENOUS

## 2020-08-31 MED ORDER — TETRACAINE HCL 0.5 % OP SOLN
1.0000 [drp] | OPHTHALMIC | Status: DC | PRN
  Administered 2020-08-31 (×3): 1 [drp] via OPHTHALMIC

## 2020-08-31 MED ORDER — BRIMONIDINE TARTRATE-TIMOLOL 0.2-0.5 % OP SOLN
OPHTHALMIC | Status: DC | PRN
  Administered 2020-08-31: 1 [drp] via OPHTHALMIC

## 2020-08-31 MED ORDER — PHENYLEPHRINE HCL 10 % OP SOLN
1.0000 [drp] | OPHTHALMIC | Status: DC | PRN
  Administered 2020-08-31 (×3): 1 [drp] via OPHTHALMIC

## 2020-08-31 MED ORDER — LACTATED RINGERS IV SOLN: INTRAVENOUS | Status: DC

## 2020-08-31 SURGICAL SUPPLY — 16 items
CANNULA ANT/CHMB 27G (MISCELLANEOUS) ×2 IMPLANT
CANNULA ANT/CHMB 27GA (MISCELLANEOUS) ×4 IMPLANT
GLOVE SURG ENC TEXT LTX SZ8 (GLOVE) ×2 IMPLANT
GLOVE SURG TRIUMPH 8.0 PF LTX (GLOVE) ×2 IMPLANT
GOWN STRL REUS W/ TWL LRG LVL3 (GOWN DISPOSABLE) ×2 IMPLANT
GOWN STRL REUS W/TWL LRG LVL3 (GOWN DISPOSABLE) ×4
LENS IOL TECNIS EYHANCE 21.5 (Intraocular Lens) ×1 IMPLANT
MARKER SKIN DUAL TIP RULER LAB (MISCELLANEOUS) ×2 IMPLANT
NDL FILTER BLUNT 18X1 1/2 (NEEDLE) ×1 IMPLANT
NEEDLE FILTER BLUNT 18X 1/2SAF (NEEDLE) ×1
NEEDLE FILTER BLUNT 18X1 1/2 (NEEDLE) ×1 IMPLANT
PACK EYE AFTER SURG (MISCELLANEOUS) ×2 IMPLANT
SYR 3ML LL SCALE MARK (SYRINGE) ×2 IMPLANT
SYR TB 1ML LUER SLIP (SYRINGE) ×2 IMPLANT
WATER STERILE IRR 250ML POUR (IV SOLUTION) ×2 IMPLANT
WIPE NON LINTING 3.25X3.25 (MISCELLANEOUS) ×2 IMPLANT

## 2020-08-31 NOTE — Anesthesia Procedure Notes (Signed)
Procedure Name: MAC Date/Time: 08/31/2020 12:01 PM Performed by: Mayme Genta, CRNA Pre-anesthesia Checklist: Patient identified, Emergency Drugs available, Suction available, Timeout performed and Patient being monitored Patient Re-evaluated:Patient Re-evaluated prior to induction Oxygen Delivery Method: Nasal cannula Placement Confirmation: positive ETCO2

## 2020-08-31 NOTE — Anesthesia Postprocedure Evaluation (Signed)
Anesthesia Post Note  Patient: Johnny [name]  Procedure(s) Performed: CATARACT EXTRACTION PHACO AND INTRAOCULAR LENS PLACEMENT (IOC) LEFT 5.53 00:38.7 (Left: Eye)     Patient location during evaluation: PACU Anesthesia Type: MAC Level of consciousness: awake and alert, oriented and patient cooperative Pain management: pain level controlled Vital Signs Assessment: post-procedure vital signs reviewed and stable Respiratory status: spontaneous breathing, nonlabored ventilation and respiratory function stable Cardiovascular status: blood pressure returned to baseline and stable Postop Assessment: adequate PO intake Anesthetic complications: no   No notable events documented.  Darrin Nipper

## 2020-08-31 NOTE — Op Note (Signed)
PREOPERATIVE DIAGNOSIS:  Nuclear sclerotic cataract of the left eye.   POSTOPERATIVE DIAGNOSIS:  Nuclear sclerotic cataract of the left eye.   OPERATIVE PROCEDURE:ORPROCALL@   SURGEON:  Birder Robson, MD.   ANESTHESIA:  Anesthesiologist: Darrin Nipper, MD CRNA: Mayme Genta, CRNA  1.      Managed anesthesia care. 2.     0.85ml of Shugarcaine was instilled following the paracentesis   COMPLICATIONS:  None.   TECHNIQUE:   Stop and chop   DESCRIPTION OF PROCEDURE:  The patient was examined and consented in the preoperative holding area where the aforementioned topical anesthesia was applied to the left eye and then brought back to the Operating Room where the left eye was prepped and draped in the usual sterile ophthalmic fashion and a lid speculum was placed. A paracentesis was created with the side port blade and the anterior chamber was filled with viscoelastic. A near clear corneal incision was performed with the steel keratome. A continuous curvilinear capsulorrhexis was performed with a cystotome followed by the capsulorrhexis forceps. Hydrodissection and hydrodelineation were carried out with BSS on a blunt cannula. The lens was removed in a stop and chop  technique and the remaining cortical material was removed with the irrigation-aspiration handpiece. The capsular bag was inflated with viscoelastic and the Technis [id number] lens was placed in the capsular bag without complication. The remaining viscoelastic was removed from the eye with the irrigation-aspiration handpiece. The wounds were hydrated. The anterior chamber was flushed with BSS and the eye was inflated to physiologic pressure. 0.22ml Vigamox was placed in the anterior chamber. The wounds were found to be water tight. The eye was dressed with Combigan. The patient was given protective glasses to wear throughout the day and a shield with which to sleep tonight. The patient was also given drops with which to begin a drop regimen  today and will follow-up with me in one day. Implant Name Type Inv. Item Serial No. Manufacturer Lot No. LRB No. Used Action  LENS IOL TECNIS EYHANCE 21.5 - [id number] Intraocular Lens LENS IOL TECNIS EYHANCE 21.5 [id number] JOHNSON   Left 1 Implanted    Procedure(s) with comments: CATARACT EXTRACTION PHACO AND INTRAOCULAR LENS PLACEMENT (IOC) LEFT 5.53 00:38.7 (Left) - DEXTENZA Diabetic - oral meds  Electronically signed: Birder Robson 08/31/2020 12:14 PM

## 2020-08-31 NOTE — H&P (Signed)
Johnny [name]   Primary Care Physician:  Baxter Hire, MD Ophthalmologist: Dr. George Ina  Pre-Procedure History & Physical: HPI:  GRABIEL [name] is a 80 y.o. male here for cataract surgery.   Past Medical History:  Diagnosis Date   Anginal pain ([place])    Coronary artery disease 1998   CABG x 3   Depression    Diabetes mellitus without complication ([place])    Hyperlipidemia    Hypertension    Myocardial infarction [facility])    SVT (supraventricular tachycardia) ([place])    Wears dentures    full upper, waiting on lower    Past Surgical History:  Procedure Laterality Date   CARDIAC CATHETERIZATION Left 12/07/2015   Procedure: Left Heart Cath and Cors/Grafts Angiography;  Surgeon: Corey Skains, MD;  Location: [place] [facility];  Service: Cardiovascular;  Laterality: Left;   CORONARY ARTERY BYPASS GRAFT  1998   [facility]   SVT ABLATION N/A 03/13/2018   Procedure: SVT ABLATION;  Surgeon: Constance Haw, MD;  Location: [place] [facility];  Service: Cardiovascular;  Laterality: N/A;    Prior to Admission medications   Medication Sig Start Date End Date Taking? Authorizing Provider  allopurinol (ZYLOPRIM) 100 MG tablet Take 100 mg by mouth daily.   Yes [provider]  aspirin EC 81 MG tablet Take 1 tablet (81 mg total) by mouth daily. 02/21/18  Yes Minna Merritts, MD  ezetimibe (ZETIA) 10 MG tablet Take 1 tablet (10 mg total) by mouth daily. 02/10/19  Yes Gollan, Kathlene November, MD  glipiZIDE (GLUCOTROL) 10 MG tablet Take 10 mg by mouth 2 (two) times daily before a meal.   Yes [provider]  metFORMIN (GLUCOPHAGE) 1000 MG tablet Take 1,000 mg by mouth 2 (two) times daily with a meal.    Yes [provider]  omeprazole (PRILOSEC) 20 MG capsule Take 20 mg by mouth daily.   Yes [provider]  rOPINIRole (REQUIP) 0.25 MG tablet Take 1 tablet (0.25 mg total) by mouth at bedtime. 09/04/17  Yes [name], MD  sertraline (ZOLOFT) 100 MG  tablet Take 100 mg by mouth daily. 11/01/17  Yes [provider]  simvastatin (ZOCOR) 40 MG tablet Take 40 mg by mouth every evening.   Yes [provider]    Allergies as of 07/20/2020 - Review Complete 01/26/2020  Allergen Reaction Noted   Ace inhibitors Cough and Other (See Comments) 12/07/2015    Family History  Problem Relation Age of Onset   Heart attack Father    Heart attack Sister    Heart attack Brother    Heart attack Brother    Heart attack Sister    Heart attack Sister     Social History   Socioeconomic History   Marital status: Married    Spouse name: Not on file   Number of children: Not on file   Years of education: Not on file   Highest education level: Not on file  Occupational History   Not on file  Tobacco Use   Smoking status: Former    Packs/day: 1.50    Years: 25.00    Pack years: 37.50    Types: Cigarettes    Quit date: 03/24/1978    Years since quitting: 42.4   Smokeless tobacco: Never  Vaping Use   Vaping Use: Never used  Substance and Sexual Activity   Alcohol use: No   Drug use: No   Sexual activity: Not on file  Other Topics Concern   Not on file  Social History Narrative   Not on file   Social Determinants of Health   Financial Resource Strain: Not on file  Food Insecurity: Not on file  Transportation Needs: Not on file  Physical Activity: Not on file  Stress: Not on file  Social Connections: Not on file  Intimate Partner Violence: Not on file    Review of Systems: See HPI, otherwise negative ROS  Physical Exam: BP 132/75   Pulse 87   Temp (!) 97.2 F (36.2 C) (Temporal)   Resp 16   Ht 5\' 10"  (1.778 m)   Wt 72.1 kg   SpO2 97%   BMI 22.81 kg/m  General:   Alert, cooperative in NAD Head:  Normocephalic and atraumatic. Respiratory:  Normal work of breathing. Cardiovascular:  RRR  Impression/Plan: Johnny [name] is here for cataract surgery.  Risks, benefits, limitations, and alternatives  regarding cataract surgery have been reviewed with the patient.  Questions have been answered.  All parties agreeable.   Birder Robson, MD  08/31/2020, 11:51 AM

## 2020-08-31 NOTE — Transfer of Care (Signed)
Immediate Anesthesia Transfer of Care Note  Patient: Johnny [name]  Procedure(s) Performed: CATARACT EXTRACTION PHACO AND INTRAOCULAR LENS PLACEMENT (IOC) LEFT 5.53 00:38.7 (Left: Eye)  Patient Location: PACU  Anesthesia Type: MAC  Level of Consciousness: awake, alert  and patient cooperative  Airway and Oxygen Therapy: Patient Spontanous Breathing and Patient connected to supplemental oxygen  Post-op Assessment: Post-op Vital signs reviewed, Patient's Cardiovascular Status Stable, Respiratory Function Stable, Patent Airway and No signs of Nausea or vomiting  Post-op Vital Signs: Reviewed and stable  Complications: No notable events documented.

## 2020-09-01 ENCOUNTER — Encounter: Payer: Self-pay | Admitting: Ophthalmology

## 2020-09-03 ENCOUNTER — Encounter: Payer: Self-pay | Admitting: Ophthalmology

## 2020-09-13 NOTE — Discharge Instructions (Signed)

## 2020-09-14 ENCOUNTER — Ambulatory Visit
Admission: RE | Admit: 2020-09-14 | Discharge: 2020-09-14 | Disposition: A | Discharge status: Home / Self Care | Payer: Medicare Other | Attending: Ophthalmology | Admitting: Ophthalmology

## 2020-09-14 ENCOUNTER — Other Ambulatory Visit: Payer: Self-pay

## 2020-09-14 ENCOUNTER — Encounter: Payer: Self-pay | Admitting: Ophthalmology

## 2020-09-14 ENCOUNTER — Ambulatory Visit: Payer: Medicare Other | Admitting: Anesthesiology

## 2020-09-14 ENCOUNTER — Encounter
Admission: RE | Disposition: A | Discharge status: Home / Self Care | Payer: Self-pay | Source: Home / Self Care | Attending: Ophthalmology

## 2020-09-14 DIAGNOSIS — I10 Essential (primary) hypertension: Secondary | ICD-10-CM | POA: Insufficient documentation

## 2020-09-14 DIAGNOSIS — Z7984 Long term (current) use of oral hypoglycemic drugs: Secondary | ICD-10-CM | POA: Diagnosis not present

## 2020-09-14 DIAGNOSIS — E1136 Type 2 diabetes mellitus with diabetic cataract: Secondary | ICD-10-CM | POA: Diagnosis present

## 2020-09-14 DIAGNOSIS — Z87891 Personal history of nicotine dependence: Secondary | ICD-10-CM | POA: Diagnosis not present

## 2020-09-14 DIAGNOSIS — Z79899 Other long term (current) drug therapy: Secondary | ICD-10-CM | POA: Insufficient documentation

## 2020-09-14 DIAGNOSIS — H2511 Age-related nuclear cataract, right eye: Secondary | ICD-10-CM | POA: Insufficient documentation

## 2020-09-14 DIAGNOSIS — Z888 Allergy status to other drugs, medicaments and biological substances status: Secondary | ICD-10-CM | POA: Insufficient documentation

## 2020-09-14 DIAGNOSIS — I252 Old myocardial infarction: Secondary | ICD-10-CM | POA: Diagnosis not present

## 2020-09-14 DIAGNOSIS — E785 Hyperlipidemia, unspecified: Secondary | ICD-10-CM | POA: Diagnosis not present

## 2020-09-14 DIAGNOSIS — I2510 Atherosclerotic heart disease of native coronary artery without angina pectoris: Secondary | ICD-10-CM | POA: Insufficient documentation

## 2020-09-14 DIAGNOSIS — Z951 Presence of aortocoronary bypass graft: Secondary | ICD-10-CM | POA: Diagnosis not present

## 2020-09-14 DIAGNOSIS — Z8249 Family history of ischemic heart disease and other diseases of the circulatory system: Secondary | ICD-10-CM | POA: Insufficient documentation

## 2020-09-14 DIAGNOSIS — Z7982 Long term (current) use of aspirin: Secondary | ICD-10-CM | POA: Diagnosis not present

## 2020-09-14 HISTORY — PX: CATARACT EXTRACTION W/PHACO: SHX586

## 2020-09-14 LAB — GLUCOSE, CAPILLARY
Glucose-Capillary: 169 mg/dL — ABNORMAL HIGH (ref 70–99)
Glucose-Capillary: 149 mg/dL — ABNORMAL HIGH (ref 70–99)

## 2020-09-14 SURGERY — PHACOEMULSIFICATION, CATARACT, WITH IOL INSERTION
Anesthesia: Monitor Anesthesia Care | Site: Eye | Laterality: Right

## 2020-09-14 MED ORDER — ACETAMINOPHEN 325 MG PO TABS
325.0000 mg | ORAL_TABLET | ORAL | Status: DC | PRN
Start: 2020-09-14 — End: 2020-09-14

## 2020-09-14 MED ORDER — ACETAMINOPHEN 160 MG/5ML PO SOLN: 325.0000 mg | ORAL | Status: DC | PRN

## 2020-09-14 MED ORDER — SIGHTPATH DOSE#1 BSS IO SOLN
INTRAOCULAR | Status: DC | PRN
  Administered 2020-09-14: 15 mL via INTRAOCULAR

## 2020-09-14 MED ORDER — SIGHTPATH DOSE#1 BSS IO SOLN
INTRAOCULAR | Status: DC | PRN
  Administered 2020-09-14: 2 mL

## 2020-09-14 MED ORDER — SIGHTPATH DOSE#1 NA CHONDROIT SULF-NA HYALURON 40-17 MG/ML IO SOLN
INTRAOCULAR | Status: DC | PRN
  Administered 2020-09-14: 1 mL via INTRAOCULAR

## 2020-09-14 MED ORDER — FENTANYL CITRATE (PF) 100 MCG/2ML IJ SOLN
INTRAMUSCULAR | Status: DC | PRN
  Administered 2020-09-14 (×2): 25 ug via INTRAVENOUS

## 2020-09-14 MED ORDER — MOXIFLOXACIN HCL 0.5 % OP SOLN
OPHTHALMIC | Status: DC | PRN
  Administered 2020-09-14: 0.2 mL via OPHTHALMIC

## 2020-09-14 MED ORDER — BRIMONIDINE TARTRATE-TIMOLOL 0.2-0.5 % OP SOLN
OPHTHALMIC | Status: DC | PRN
  Administered 2020-09-14: 1 [drp] via OPHTHALMIC

## 2020-09-14 MED ORDER — SIGHTPATH DOSE#1 BSS IO SOLN
INTRAOCULAR | Status: DC | PRN
  Administered 2020-09-14: 53 mL via OPHTHALMIC

## 2020-09-14 MED ORDER — DEXAMETHASONE 0.4 MG OP INST
VAGINAL_INSERT | OPHTHALMIC | Status: DC | PRN
  Administered 2020-09-14: 0.4 mg via OPHTHALMIC

## 2020-09-14 MED ORDER — PHENYLEPHRINE HCL 10 % OP SOLN
1.0000 [drp] | OPHTHALMIC | Status: DC | PRN
  Administered 2020-09-14 (×3): 1 [drp] via OPHTHALMIC

## 2020-09-14 MED ORDER — CYCLOPENTOLATE HCL 2 % OP SOLN
1.0000 [drp] | OPHTHALMIC | Status: DC | PRN
  Administered 2020-09-14 (×3): 1 [drp] via OPHTHALMIC

## 2020-09-14 MED ORDER — TETRACAINE HCL 0.5 % OP SOLN
1.0000 [drp] | OPHTHALMIC | Status: DC | PRN
  Administered 2020-09-14 (×3): 1 [drp] via OPHTHALMIC

## 2020-09-14 MED ORDER — ONDANSETRON HCL 4 MG/2ML IJ SOLN: 4.0000 mg | Freq: Once | INTRAMUSCULAR | Status: DC | PRN

## 2020-09-14 SURGICAL SUPPLY — 16 items
CANNULA ANT/CHMB 27G (MISCELLANEOUS) ×2 IMPLANT
CANNULA ANT/CHMB 27GA (MISCELLANEOUS) ×4 IMPLANT
GLOVE SURG ENC TEXT LTX SZ8 (GLOVE) ×2 IMPLANT
GLOVE SURG TRIUMPH 8.0 PF LTX (GLOVE) ×2 IMPLANT
GOWN STRL REUS W/ TWL LRG LVL3 (GOWN DISPOSABLE) ×2 IMPLANT
GOWN STRL REUS W/TWL LRG LVL3 (GOWN DISPOSABLE) ×4
LENS IOL TECNIS EYHANCE 22.0 (Intraocular Lens) ×1 IMPLANT
MARKER SKIN DUAL TIP RULER LAB (MISCELLANEOUS) ×2 IMPLANT
NDL FILTER BLUNT 18X1 1/2 (NEEDLE) ×1 IMPLANT
NEEDLE FILTER BLUNT 18X 1/2SAF (NEEDLE) ×1
NEEDLE FILTER BLUNT 18X1 1/2 (NEEDLE) ×1 IMPLANT
PACK EYE AFTER SURG (MISCELLANEOUS) ×2 IMPLANT
SYR 3ML LL SCALE MARK (SYRINGE) ×2 IMPLANT
SYR TB 1ML LUER SLIP (SYRINGE) ×2 IMPLANT
WATER STERILE IRR 250ML POUR (IV SOLUTION) ×2 IMPLANT
WIPE NON LINTING 3.25X3.25 (MISCELLANEOUS) ×2 IMPLANT

## 2020-09-14 NOTE — Anesthesia Postprocedure Evaluation (Signed)
Anesthesia Post Note  Patient: Johnny [name]  Procedure(s) Performed: CATARACT EXTRACTION PHACO AND INTRAOCULAR LENS PLACEMENT (IOC) RIGHT 6.87 00:53.5 (Right: Eye)     Patient location during evaluation: PACU Anesthesia Type: MAC Level of consciousness: awake Pain management: pain level controlled Vital Signs Assessment: post-procedure vital signs reviewed and stable Respiratory status: respiratory function stable Cardiovascular status: stable Postop Assessment: no apparent nausea or vomiting Anesthetic complications: no   No notable events documented.  Veda Canning

## 2020-09-14 NOTE — H&P (Signed)
[place]   Primary Care Physician:  Baxter Hire, MD Ophthalmologist: Dr. George Ina  Pre-Procedure History & Physical: HPI:  Johnny [name] is a 80 y.o. male here for cataract surgery.   Past Medical History:  Diagnosis Date   Anginal pain ([place])    Coronary artery disease 1998   CABG x 3   Depression    Diabetes mellitus without complication ([place])    Hyperlipidemia    Hypertension    Myocardial infarction [facility])    SVT (supraventricular tachycardia) ([place])    Wears dentures    full upper, waiting on lower    Past Surgical History:  Procedure Laterality Date   CARDIAC CATHETERIZATION Left 12/07/2015   Procedure: Left Heart Cath and Cors/Grafts Angiography;  Surgeon: Corey Skains, MD;  Location: [place] [facility];  Service: Cardiovascular;  Laterality: Left;   CATARACT EXTRACTION W/PHACO Left 08/31/2020   Procedure: CATARACT EXTRACTION PHACO AND INTRAOCULAR LENS PLACEMENT (IOC) LEFT 5.53 00:38.7;  Surgeon: Birder Robson, MD;  Location: [place];  Service: Ophthalmology;  Laterality: Left;  [place] Diabetic - oral meds   CORONARY ARTERY BYPASS GRAFT  1998   [facility]   SVT ABLATION N/A 03/13/2018   Procedure: SVT ABLATION;  Surgeon: Constance Haw, MD;  Location: [place] [facility];  Service: Cardiovascular;  Laterality: N/A;    Prior to Admission medications   Medication Sig Start Date End Date Taking? Authorizing Provider  allopurinol (ZYLOPRIM) 100 MG tablet Take 100 mg by mouth daily.   Yes [provider]  aspirin EC 81 MG tablet Take 1 tablet (81 mg total) by mouth daily. 02/21/18  Yes Minna Merritts, MD  ezetimibe (ZETIA) 10 MG tablet Take 1 tablet (10 mg total) by mouth daily. 02/10/19  Yes Gollan, Kathlene November, MD  glipiZIDE (GLUCOTROL) 10 MG tablet Take 10 mg by mouth 2 (two) times daily before a meal.   Yes [provider]  metFORMIN (GLUCOPHAGE) 1000 MG tablet Take 1,000 mg by mouth 2 (two) times daily with a meal.     Yes [provider]  omeprazole (PRILOSEC) 20 MG capsule Take 20 mg by mouth daily.   Yes [provider]  rOPINIRole (REQUIP) 0.25 MG tablet Take 1 tablet (0.25 mg total) by mouth at bedtime. 09/04/17  Yes [name], MD  sertraline (ZOLOFT) 100 MG tablet Take 100 mg by mouth daily. 11/01/17  Yes [provider]  simvastatin (ZOCOR) 40 MG tablet Take 40 mg by mouth every evening.   Yes [provider]    Allergies as of 07/20/2020 - Review Complete 01/26/2020  Allergen Reaction Noted   Ace inhibitors Cough and Other (See Comments) 12/07/2015    Family History  Problem Relation Age of Onset   Heart attack Father    Heart attack Sister    Heart attack Brother    Heart attack Brother    Heart attack Sister    Heart attack Sister     Social History   Socioeconomic History   Marital status: Married    Spouse name: Not on file   Number of children: Not on file   Years of education: Not on file   Highest education level: Not on file  Occupational History   Not on file  Tobacco Use   Smoking status: Former    Packs/day: 1.50    Years: 25.00    Pack years: 37.50    Types: Cigarettes    Quit date: 03/24/1978  Years since quitting: 42.5   Smokeless tobacco: Never  Vaping Use   Vaping Use: Never used  Substance and Sexual Activity   Alcohol use: No   Drug use: No   Sexual activity: Not on file  Other Topics Concern   Not on file  Social History Narrative   Not on file   Social Determinants of Health   Financial Resource Strain: Not on file  Food Insecurity: Not on file  Transportation Needs: Not on file  Physical Activity: Not on file  Stress: Not on file  Social Connections: Not on file  Intimate Partner Violence: Not on file    Review of Systems: See HPI, otherwise negative ROS  Physical Exam: BP 132/87   Pulse 88   Temp (!) 97.1 F (36.2 C)   Resp 18   Ht '5\' 10"'$  (1.778 m)   Wt 72.1 kg   SpO2 96%   BMI 22.81  kg/m  General:   Alert, cooperative in NAD Head:  Normocephalic and atraumatic. Respiratory:  Normal work of breathing. Cardiovascular:  RRR  Impression/Plan: Johnny [name] is here for cataract surgery.  Risks, benefits, limitations, and alternatives regarding cataract surgery have been reviewed with the patient.  Questions have been answered.  All parties agreeable.   Birder Robson, MD  09/14/2020, 11:09 AM

## 2020-09-14 NOTE — Anesthesia Procedure Notes (Signed)
Procedure Name: MAC Date/Time: 09/14/2020 11:16 AM Performed by: Dionne Bucy, CRNA Pre-anesthesia Checklist: Patient identified, Emergency Drugs available, Suction available, Patient being monitored and Timeout performed Patient Re-evaluated:Patient Re-evaluated prior to induction Oxygen Delivery Method: Nasal cannula Placement Confirmation: positive ETCO2

## 2020-09-14 NOTE — Transfer of Care (Signed)
Immediate Anesthesia Transfer of Care Note  Patient: Johnny [name]  Procedure(s) Performed: CATARACT EXTRACTION PHACO AND INTRAOCULAR LENS PLACEMENT (IOC) RIGHT 6.87 00:53.5 (Right: Eye)  Patient Location: PACU  Anesthesia Type: MAC  Level of Consciousness: awake, alert  and patient cooperative  Airway and Oxygen Therapy: Patient Spontanous Breathing and Patient connected to supplemental oxygen  Post-op Assessment: Post-op Vital signs reviewed, Patient's Cardiovascular Status Stable, Respiratory Function Stable, Patent Airway and No signs of Nausea or vomiting  Post-op Vital Signs: Reviewed and stable  Complications: No notable events documented.

## 2020-09-14 NOTE — Op Note (Signed)
PREOPERATIVE DIAGNOSIS:  Nuclear sclerotic cataract of the right eye.   POSTOPERATIVE DIAGNOSIS:  Cataract   OPERATIVE PROCEDURE:ORPROCALL@   SURGEON:  Birder Robson, MD.   ANESTHESIA:  Anesthesiologist: Veda Canning, MD CRNA: Dionne Bucy, CRNA  1.      Managed anesthesia care. 2.      0.40m of Shugarcaine was instilled in the eye following the paracentesis.   COMPLICATIONS:  None.   TECHNIQUE:   Stop and chop   DESCRIPTION OF PROCEDURE:  The patient was examined and consented in the preoperative holding area where the aforementioned topical anesthesia was applied to the right eye and then brought back to the Operating Room where the right eye was prepped and draped in the usual sterile ophthalmic fashion and a lid speculum was placed. A paracentesis was created with the side port blade and the anterior chamber was filled with viscoelastic. A near clear corneal incision was performed with the steel keratome. A continuous curvilinear capsulorrhexis was performed with a cystotome followed by the capsulorrhexis forceps. Hydrodissection and hydrodelineation were carried out with BSS on a blunt cannula. The lens was removed in a stop and chop  technique and the remaining cortical material was removed with the irrigation-aspiration handpiece. The capsular bag was inflated with viscoelastic and the Technis [id number]  lens was placed in the capsular bag without complication. The remaining viscoelastic was removed from the eye with the irrigation-aspiration handpiece. The wounds were hydrated. The anterior chamber was flushed with BSS and the eye was inflated to physiologic pressure. 0.131mof Vigamox was placed in the anterior chamber. The wounds were found to be water tight. The eye was dressed with Combigan. The patient was given protective glasses to wear throughout the day and a shield with which to sleep tonight. The patient was also given drops with which to begin a drop regimen today and will  follow-up with me in one day. Implant Name Type Inv. Item Serial No. Manufacturer Lot No. LRB No. Used Action  LENS IOL TECNIS EYHANCE 22.0 - [id number] Lens LENS IOL TECNIS EYHANCE 22.0 [id number]   Right 1 Implanted   Procedure(s) with comments: CATARACT EXTRACTION PHACO AND INTRAOCULAR LENS PLACEMENT (IOC) RIGHT 6.87 00:53.5 (Right) - [place] Diabetic - oral meds  Electronically signed: WiBirder Robson/26/2022 11:34 AM

## 2020-09-14 NOTE — Anesthesia Preprocedure Evaluation (Signed)
Anesthesia Evaluation  Patient identified by MRN, date of birth, ID band Patient awake    Reviewed: Allergy & Precautions, NPO status   History of Anesthesia Complications Negative for: history of anesthetic complications  Airway Mallampati: I   Neck ROM: Full    Dental  (+) Edentulous Lower, Edentulous Upper   Pulmonary former smoker,    breath sounds clear to auscultation       Cardiovascular hypertension, (-) angina+ CAD (s/p MI and CABG)  + dysrhythmias (SVT s/p ablation)  Rhythm:Regular Rate:Normal  Ischemic cardiomyopathy, EF 35-40%   Neuro/Psych PSYCHIATRIC DISORDERS Depression    GI/Hepatic   Endo/Other  diabetes, Type 2  Renal/GU Renal disease (stage III CKD)     Musculoskeletal   Abdominal   Peds  Hematology   Anesthesia Other Findings   Reproductive/Obstetrics                             Anesthesia Physical  Anesthesia Plan  ASA: 3  Anesthesia Plan: MAC   Post-op Pain Management:    Induction: Intravenous  PONV Risk Score and Plan: 1 and TIVA, Midazolam and Treatment may vary due to age or medical condition  Airway Management Planned: Nasal Cannula  Additional Equipment:   Intra-op Plan:   Post-operative Plan:   Informed Consent: I have reviewed the patients History and Physical, chart, labs and discussed the procedure including the risks, benefits and alternatives for the proposed anesthesia with the patient or authorized representative who has indicated his/her understanding and acceptance.   Patient has DNR.  Discussed DNR with patient.     Plan Discussed with: CRNA  Anesthesia Plan Comments:         Anesthesia Quick Evaluation

## 2020-09-15 ENCOUNTER — Encounter: Payer: Self-pay | Admitting: Ophthalmology

## 2021-01-11 ENCOUNTER — Other Ambulatory Visit: Payer: Self-pay | Admitting: Internal Medicine

## 2021-01-11 DIAGNOSIS — R112 Nausea with vomiting, unspecified: Secondary | ICD-10-CM

## 2021-01-11 DIAGNOSIS — E118 Type 2 diabetes mellitus with unspecified complications: Secondary | ICD-10-CM

## 2021-01-28 ENCOUNTER — Other Ambulatory Visit: Payer: Self-pay

## 2021-01-28 ENCOUNTER — Encounter
Admission: RE | Admit: 2021-01-28 | Discharge: 2021-01-28 | Disposition: A | Discharge status: Home / Self Care | Payer: Medicare Other | Source: Ambulatory Visit | Attending: Internal Medicine | Admitting: Internal Medicine

## 2021-01-28 DIAGNOSIS — R112 Nausea with vomiting, unspecified: Secondary | ICD-10-CM | POA: Insufficient documentation

## 2021-01-28 DIAGNOSIS — E118 Type 2 diabetes mellitus with unspecified complications: Secondary | ICD-10-CM | POA: Insufficient documentation

## 2021-01-28 MED ORDER — TECHNETIUM TC 99M SULFUR COLLOID
2.3900 | Freq: Once | INTRAVENOUS | Status: AC | PRN
  Administered 2021-01-28: 2.39 via ORAL

## 2021-04-26 ENCOUNTER — Encounter: Payer: Self-pay | Admitting: Internal Medicine

## 2021-04-27 ENCOUNTER — Encounter: Payer: Self-pay | Admitting: Internal Medicine

## 2021-04-27 ENCOUNTER — Ambulatory Visit: Payer: Medicare Other | Admitting: Certified Registered Nurse Anesthetist

## 2021-04-27 ENCOUNTER — Other Ambulatory Visit: Payer: Self-pay

## 2021-04-27 ENCOUNTER — Ambulatory Visit
Admission: RE | Admit: 2021-04-27 | Discharge: 2021-04-27 | Disposition: A | Discharge status: Home / Self Care | Payer: Medicare Other | Attending: Internal Medicine | Admitting: Internal Medicine

## 2021-04-27 ENCOUNTER — Encounter
Admission: RE | Disposition: A | Discharge status: Home / Self Care | Payer: Self-pay | Source: Home / Self Care | Attending: Internal Medicine

## 2021-04-27 DIAGNOSIS — K2970 Gastritis, unspecified, without bleeding: Secondary | ICD-10-CM | POA: Diagnosis not present

## 2021-04-27 DIAGNOSIS — Z955 Presence of coronary angioplasty implant and graft: Secondary | ICD-10-CM | POA: Diagnosis not present

## 2021-04-27 DIAGNOSIS — Z951 Presence of aortocoronary bypass graft: Secondary | ICD-10-CM | POA: Diagnosis not present

## 2021-04-27 DIAGNOSIS — I252 Old myocardial infarction: Secondary | ICD-10-CM | POA: Diagnosis not present

## 2021-04-27 DIAGNOSIS — K2100 Gastro-esophageal reflux disease with esophagitis, without bleeding: Secondary | ICD-10-CM | POA: Diagnosis present

## 2021-04-27 DIAGNOSIS — K3189 Other diseases of stomach and duodenum: Secondary | ICD-10-CM | POA: Insufficient documentation

## 2021-04-27 DIAGNOSIS — Z79899 Other long term (current) drug therapy: Secondary | ICD-10-CM | POA: Insufficient documentation

## 2021-04-27 DIAGNOSIS — R112 Nausea with vomiting, unspecified: Secondary | ICD-10-CM | POA: Diagnosis not present

## 2021-04-27 DIAGNOSIS — E1121 Type 2 diabetes mellitus with diabetic nephropathy: Secondary | ICD-10-CM | POA: Insufficient documentation

## 2021-04-27 DIAGNOSIS — I10 Essential (primary) hypertension: Secondary | ICD-10-CM | POA: Insufficient documentation

## 2021-04-27 DIAGNOSIS — I2510 Atherosclerotic heart disease of native coronary artery without angina pectoris: Secondary | ICD-10-CM | POA: Insufficient documentation

## 2021-04-27 HISTORY — DX: Peptic ulcer, site unspecified, unspecified as acute or chronic, without hemorrhage or perforation: K27.9

## 2021-04-27 HISTORY — DX: Psoriasis, unspecified: L40.9

## 2021-04-27 HISTORY — DX: Calculus of kidney: N20.0

## 2021-04-27 HISTORY — PX: ESOPHAGOGASTRODUODENOSCOPY (EGD) WITH PROPOFOL: SHX5813

## 2021-04-27 HISTORY — DX: Personal history of urinary calculi: Z87.442

## 2021-04-27 HISTORY — DX: Obesity, unspecified: E66.9

## 2021-04-27 LAB — GLUCOSE, CAPILLARY: Glucose-Capillary: 185 mg/dL — ABNORMAL HIGH (ref 70–99)

## 2021-04-27 SURGERY — ESOPHAGOGASTRODUODENOSCOPY (EGD) WITH PROPOFOL
Anesthesia: General

## 2021-04-27 MED ORDER — LIDOCAINE HCL (CARDIAC) PF 100 MG/5ML IV SOSY
PREFILLED_SYRINGE | INTRAVENOUS | Status: DC | PRN
  Administered 2021-04-27: 100 mg via INTRAVENOUS

## 2021-04-27 MED ORDER — PHENYLEPHRINE 40 MCG/ML (10ML) SYRINGE FOR IV PUSH (FOR BLOOD PRESSURE SUPPORT)
PREFILLED_SYRINGE | INTRAVENOUS | Status: DC | PRN
  Administered 2021-04-27: 80 ug via INTRAVENOUS

## 2021-04-27 MED ORDER — PROPOFOL 500 MG/50ML IV EMUL
INTRAVENOUS | Status: AC
  Filled 2021-04-27: qty 150

## 2021-04-27 MED ORDER — PROPOFOL 500 MG/50ML IV EMUL
INTRAVENOUS | Status: DC | PRN
  Administered 2021-04-27: 150 ug/kg/min via INTRAVENOUS

## 2021-04-27 MED ORDER — PROPOFOL 10 MG/ML IV BOLUS
INTRAVENOUS | Status: DC | PRN
  Administered 2021-04-27: 30 mg via INTRAVENOUS
  Administered 2021-04-27: 50 mg via INTRAVENOUS

## 2021-04-27 MED ORDER — SODIUM CHLORIDE 0.9 % IV SOLN: INTRAVENOUS | Status: DC

## 2021-04-27 NOTE — Op Note (Signed)
[facility] ?Gastroenterology ?Patient Name: Johnny [name] ?Procedure Date: 04/27/2021 8:30 AM ?MRN: [id number] ?Account #: [id number] ?Date of Birth: 01/02/1941 ?Admit Type: Outpatient ?Age: 81 ?Room: [facility] ENDO ROOM 2 ?Gender: Male ?Note Status: Finalized ?Instrument Name: Upper Endoscope [id number] ?Procedure:             Upper GI endoscopy ?Indications:           Gastro-esophageal reflux disease, Nausea with vomiting ?Providers:             Benay Pike. Shantaya Bluestone MD, MD ?Medicines:             Propofol per Anesthesia ?Complications:         No immediate complications. ?Procedure:             Pre-Anesthesia Assessment: ?                       - The risks and benefits of the procedure and the  ?                       sedation options and risks were discussed with the  ?                       patient. All questions were answered and informed  ?                       consent was obtained. ?                       - Patient identification and proposed procedure were  ?                       verified prior to the procedure by the nurse. The  ?                       procedure was verified in the procedure room. ?                       - ASA Grade Assessment: II - A patient with mild  ?                       systemic disease. ?                       - After reviewing the risks and benefits, the patient  ?                       was deemed in satisfactory condition to undergo the  ?                       procedure. ?                       After obtaining informed consent, the endoscope was  ?                       passed under direct vision. Throughout the procedure,  ?                       the patient's blood pressure, pulse, and oxygen  ?  saturations were monitored continuously. The Endoscope  ?                       was introduced through the mouth, and advanced to the  ?                       third part of duodenum. The upper GI endoscopy was  ?                       accomplished without  difficulty. The patient tolerated  ?                       the procedure well. ?Findings: ?     The esophagus was normal. ?     Localized moderate inflammation characterized by congestion (edema) and  ?     erosions was found in the prepyloric region of the stomach. Biopsies  ?     were taken with a cold forceps for Helicobacter pylori testing. ?     Diffuse nodular mucosa was found in the gastric body. Biopsies were  ?     taken with a cold forceps for histology. ?     There is no endoscopic evidence of hiatal hernia in the cardia. ?     The examined duodenum was normal. ?     The exam was otherwise without abnormality. ?Impression:            - Normal esophagus. ?                       - Gastritis. Biopsied. ?                       - Nodular mucosa in the gastric body. Biopsied. ?                       - Normal examined duodenum. ?                       - The examination was otherwise normal. ?Recommendation:        - Patient has a contact number available for  ?                       emergencies. The signs and symptoms of potential  ?                       delayed complications were discussed with the patient.  ?                       Return to normal activities tomorrow. Written  ?                       discharge instructions were provided to the patient. ?                       - Resume previous diet. ?                       - Continue present medications. ?                       - Await pathology results. ?                       -  Return to my office in 1 month. ?                       - The findings and recommendations were discussed with  ?                       the patient. ?Procedure Code(s):     --- Professional --- ?                       [phone], Esophagogastroduodenoscopy, flexible,  ?                       transoral; with biopsy, single or multiple ?Diagnosis Code(s):     --- Professional --- ?                       R11.2, Nausea with vomiting, unspecified ?                       K21.9,  Gastro-esophageal reflux disease without  ?                       esophagitis ?                       K31.89, Other diseases of stomach and duodenum ?                       K29.70, Gastritis, unspecified, without bleeding ?CPT copyright 2019 [employer]. All rights reserved. ?The codes documented in this report are preliminary and upon coder review may  ?be revised to meet current compliance requirements. ?Efrain Sella MD, MD ?04/27/2021 8:51:45 AM ?This report has been signed electronically. ?Number of Addenda: 0 ?Note Initiated On: 04/27/2021 8:30 AM ?Estimated Blood Loss:  Estimated blood loss: none. ?     [facility] ?

## 2021-04-27 NOTE — Anesthesia Postprocedure Evaluation (Signed)
Anesthesia Post Note ? ?Patient: Johnny [name] ? ?Procedure(s) Performed: ESOPHAGOGASTRODUODENOSCOPY (EGD) WITH PROPOFOL ? ?Patient location during evaluation: Endoscopy ?Anesthesia Type: General ?Level of consciousness: awake and alert ?Pain management: pain level controlled ?Vital Signs Assessment: post-procedure vital signs reviewed and stable ?Respiratory status: spontaneous breathing, nonlabored ventilation, respiratory function stable and patient connected to nasal cannula oxygen ?Cardiovascular status: blood pressure returned to baseline and stable ?Postop Assessment: no apparent nausea or vomiting ?Anesthetic complications: no ? ? ?No notable events documented. ? ? ?Last Vitals:  ?Vitals:  ? 04/27/21 0850 04/27/21 0851  ?BP:  112/64  ?Pulse:    ?Resp:    ?Temp: (!) 35.6 ?C   ?SpO2:    ?  ?Last Pain:  ?Vitals:  ? 04/27/21 0925  ?TempSrc:   ?PainSc: 0-No pain  ? ? ?  ?  ?  ?  ?  ?  ? ?[name] ? ? ? ? ?

## 2021-04-27 NOTE — Anesthesia Preprocedure Evaluation (Addendum)
Anesthesia Evaluation  ?Patient identified by MRN, date of birth, ID band ?Patient awake ? ? ? ?Reviewed: ?Allergy & Precautions, NPO status , Patient's Chart, lab work & pertinent test results ? ?History of Anesthesia Complications ?Negative for: history of anesthetic complications ? ?Airway ?Mallampati: III ? ?TM Distance: <3 FB ?Neck ROM: full ? ? ? Dental ? ?(+) Upper Dentures, Lower Dentures, Implants ?  ?Pulmonary ?neg shortness of breath, former smoker,  ?  ?Pulmonary exam normal ? ? ? ? ? ? ? Cardiovascular ?hypertension, (-) angina+ CAD, + Past MI, + Cardiac Stents and + CABG  ?Normal cardiovascular exam ? ? ?  ?Neuro/Psych ?PSYCHIATRIC DISORDERS negative neurological ROS ?   ? GI/Hepatic ?Neg liver ROS, PUD, GERD  Controlled,  ?Endo/Other  ?diabetes, Type 2 ? Renal/GU ?Renal disease  ?negative genitourinary ?  ?Musculoskeletal ? ? Abdominal ?  ?Peds ? Hematology ?negative hematology ROS ?(+)   ?Anesthesia Other Findings ?Patient is NPO appropriate and reports no nausea or vomiting today. ? ?Past Medical History: ?No date: Anginal pain [facility]) ?1998: Coronary artery disease ?    Comment:  CABG x 3 ?No date: Depression ?No date: Diabetes mellitus without complication ([place]) ?No date: History of kidney stones ?No date: Hyperlipidemia ?No date: Hypertension ?No date: Myocardial infarction [facility]) ?No date: Nephrolithiasis ?No date: Obesity ?No date: Psoriasis ?No date: PUD (peptic ulcer disease) ?No date: SVT (supraventricular tachycardia) ([place]) ?No date: Wears dentures ?    Comment:  full upper, waiting on lower ? ?Past Surgical History: ?12/07/2015: CARDIAC CATHETERIZATION; Left ?    Comment:  Procedure: Left Heart Cath and Cors/Grafts Angiography;  ?             Surgeon: Corey Skains, MD;  Location: [place]  ?             CV LAB;  Service: Cardiovascular;  Laterality: Left; ?08/31/2020: CATARACT EXTRACTION W/PHACO; Left ?    Comment:  Procedure: CATARACT EXTRACTION  PHACO AND INTRAOCULAR  ?             LENS PLACEMENT (IOC) LEFT 5.53 00:38.7;  Surgeon:  ?             Birder Robson, MD;  Location: [place];   ?             Service: Ophthalmology;  Laterality: Left;   ?             [place] ?Diabetic - oral meds ?09/14/2020: CATARACT EXTRACTION W/PHACO; Right ?    Comment:  Procedure: CATARACT EXTRACTION PHACO AND INTRAOCULAR  ?             LENS PLACEMENT (IOC) RIGHT 6.87 00:53.5;  Surgeon:  ?             Birder Robson, MD;  Location: [place];   ?             Service: Ophthalmology;  Laterality: Right;   ?             [place] ?Diabetic - oral meds ?1998: CORONARY ARTERY BYPASS GRAFT ?    Comment:  [facility] ?03/13/2018: SVT ABLATION; N/A ?    Comment:  Procedure: SVT ABLATION;  Surgeon: Constance Haw, ?             MD;  Location: [place] CV LAB;  Service:  ?             Cardiovascular;  Laterality: N/A; ? ?BMI   ?  Body Mass Index: 23.68 kg/m?  ?  ? ? Reproductive/Obstetrics ?negative OB ROS ? ?  ? ? ? ? ? ? ? ? ? ? ? ? ? ?  ?  ? ? ? ? ? ? ? ?Anesthesia Physical ?Anesthesia Plan ? ?ASA: 3 ? ?Anesthesia Plan: General  ? ?Post-op Pain Management:   ? ?Induction: Intravenous ? ?PONV Risk Score and Plan: Propofol infusion and TIVA ? ?Airway Management Planned: Natural Airway and Nasal Cannula ? ?Additional Equipment:  ? ?Intra-op Plan:  ? ?Post-operative Plan:  ? ?Informed Consent: I have reviewed the patients History and Physical, chart, labs and discussed the procedure including the risks, benefits and alternatives for the proposed anesthesia with the patient or authorized representative who has indicated his/her understanding and acceptance.  ? ? ? ?Dental Advisory Given ? ?Plan Discussed with: Anesthesiologist, CRNA and Surgeon ? ?Anesthesia Plan Comments: (Patient consented for risks of anesthesia including but not limited to:  ?- adverse reactions to medications ?- risk of airway placement if required ?- damage to eyes, teeth, lips or other  oral mucosa ?- nerve damage due to positioning  ?- sore throat or hoarseness ?- Damage to heart, brain, nerves, lungs, other parts of body or loss of life ? ?Patient voiced understanding.)  ? ? ? ? ? ? ?Anesthesia Quick Evaluation ? ?

## 2021-04-27 NOTE — H&P (Signed)
Outpatient short stay form Pre-procedure ?04/27/2021 8:31 AM ?Lorie Apley K. Alice Reichert, M.D. ? ?Primary Physician: Harrel Lemon, M.D. ? ?Reason for visit:  Nausea, vomiting, GERD ? ?History of present illness: Pleasant 81 year old male with a history of type 2 diabetes mellitus and diabetic nephropathy presents with intermittent nausea and vomiting as well as GERD moderately well-controlled on daily Prilosec.  Patient has no breakthrough acid reflux symptoms as long as he takes medication regularly.  He denies any dysphagia or weight loss or hematemesis.  He denies abdominal pain. ? ? ? ?Current Facility-Administered Medications:  ?  0.9 %  sodium chloride infusion, , Intravenous, Continuous, [place], Benay Pike, MD, Last Rate: 20 mL/hr at 04/27/21 0258, Continued from Pre-op at 04/27/21 0829 ? ?Medications Prior to Admission  ?Medication Sig Dispense Refill Last Dose  ? allopurinol (ZYLOPRIM) 100 MG tablet Take 100 mg by mouth daily.   04/26/2021  ? aspirin EC 81 MG tablet Take 1 tablet (81 mg total) by mouth daily. 90 tablet 0 04/26/2021  ? ezetimibe (ZETIA) 10 MG tablet Take 1 tablet (10 mg total) by mouth daily. 90 tablet 3 04/26/2021  ? glipiZIDE (GLUCOTROL) 10 MG tablet Take 10 mg by mouth 2 (two) times daily before a meal.   04/26/2021  ? metFORMIN (GLUCOPHAGE) 1000 MG tablet Take 1,000 mg by mouth 2 (two) times daily with a meal.    04/26/2021  ? metoprolol tartrate (LOPRESSOR) 25 MG tablet Take 25 mg by mouth 2 (two) times daily.   04/26/2021  ? omeprazole (PRILOSEC) 20 MG capsule Take 20 mg by mouth daily.   04/26/2021  ? pioglitazone (ACTOS) 15 MG tablet Take 15 mg by mouth daily.   04/26/2021  ? rOPINIRole (REQUIP) 0.25 MG tablet Take 1 tablet (0.25 mg total) by mouth at bedtime. 30 tablet 1 04/26/2021  ? sertraline (ZOLOFT) 100 MG tablet Take 100 mg by mouth daily.   04/26/2021  ? simvastatin (ZOCOR) 40 MG tablet Take 40 mg by mouth every evening.   04/26/2021  ? ? ? ?Allergies  ?Allergen Reactions  ? Ace Inhibitors Other (See  Comments) and Cough  ? ? ? ?Past Medical History:  ?Diagnosis Date  ? Anginal pain ([place])   ? Coronary artery disease 1998  ? CABG x 3  ? Depression   ? Diabetes mellitus without complication ([place])   ? History of kidney stones   ? Hyperlipidemia   ? Hypertension   ? Myocardial infarction [facility])   ? Nephrolithiasis   ? Obesity   ? Psoriasis   ? PUD (peptic ulcer disease)   ? SVT (supraventricular tachycardia) ([place])   ? Wears dentures   ? full upper, waiting on lower  ? ? ?Review of systems:  Otherwise negative.  ? ? ?Physical Exam ? ?Gen: Alert, oriented. Appears stated age.  ?HEENT: [place]/AT. PERRLA. ?Lungs: CTA, no wheezes. ?CV: RR nl S1, S2. ?Abd: soft, benign, no masses. BS+ ?Ext: No edema. Pulses 2+ ? ? ? ?Planned procedures: Proceed with EGD.  The patient understands the nature of the planned procedure, indications, risks, alternatives and potential complications including but not limited to bleeding, infection, perforation, damage to internal organs and possible oversedation/side effects from anesthesia. The patient agrees and gives consent to proceed.  ?Please refer to procedure notes for findings, recommendations and patient disposition/instructions.  ? ? ? ?Ras Kollman K. Alice Reichert, M.D. ?Gastroenterology ?04/27/2021  8:31 AM ? ? ? ? ? ? ?

## 2021-04-27 NOTE — Anesthesia Procedure Notes (Signed)
Date/Time: 04/27/2021 8:33 AM ?Performed by: Lily Peer, Corah Willeford, CRNA ?Pre-anesthesia Checklist: Patient identified, Emergency Drugs available and Suction available ?Patient Re-evaluated:Patient Re-evaluated prior to induction ?Oxygen Delivery Method: Simple face mask ?Induction Type: IV induction ? ? ? ? ?

## 2021-04-27 NOTE — Transfer of Care (Signed)
Immediate Anesthesia Transfer of Care Note ? ?Patient: Johnny [name] ? ?Procedure(s) Performed: ESOPHAGOGASTRODUODENOSCOPY (EGD) WITH PROPOFOL ? ?Patient Location: Endoscopy Unit ? ?Anesthesia Type:General ? ?Level of Consciousness: drowsy ? ?Airway & Oxygen Therapy: Patient Spontanous Breathing and Patient connected to face mask oxygen ? ?Post-op Assessment: Report given to RN and Post -op Vital signs reviewed and stable ? ?Post vital signs: Reviewed and stable ? ?Last Vitals:  ?Vitals Value Taken Time  ?BP 102/58 04/27/21 0849  ?Temp    ?Pulse 59 04/27/21 0849  ?Resp 16 04/27/21 0849  ?SpO2 100 % 04/27/21 0849  ?Vitals shown include unvalidated device data. ? ?Last Pain:  ?Vitals:  ? 04/27/21 0848  ?TempSrc:   ?PainSc: 0-No pain  ?   ? ?  ? ?Complications: No notable events documented. ?

## 2021-04-27 NOTE — Interval H&P Note (Signed)
History and Physical Interval Note: ? ?04/27/2021 ?8:33 AM ? ?Johnny [name]  has presented today for surgery, with the diagnosis of R11.14  Bilious vomiting with nausea ?K21.9  Gastroesophageal reflux disease, unspecified whether esophagitis present.  The various methods of treatment have been discussed with the patient and family. After consideration of risks, benefits and other options for treatment, the patient has consented to  Procedure(s) with comments: ?ESOPHAGOGASTRODUODENOSCOPY (EGD) WITH PROPOFOL (N/A) - DM as a surgical intervention.  The patient's history has been reviewed, patient examined, no change in status, stable for surgery.  I have reviewed the patient's chart and labs.  Questions were answered to the patient's satisfaction.   ? ? ?[place], [place] ? ? ?

## 2021-04-28 ENCOUNTER — Encounter: Payer: Self-pay | Admitting: Internal Medicine

## 2021-04-28 LAB — SURGICAL PATHOLOGY

## 2021-05-26 NOTE — Progress Notes (Signed)
Cardiology Office Note ? ?Date:  05/27/2021  ? ?ID:  Johnny [name], DOB April 08, 1940, MRN [id number] ? ?PCP:  Baxter Hire, MD  ? ?Chief Complaint  ?Patient presents with  ? 12 month follow up   ?  "Doing well." Patient had an endoscopy to find out why he is vomiting daily; wondering if could be a medication he is on. Medications reviewed by the patient verbally.   ? ? ?HPI:  ?[name] is a 81 y.o. male with  ?MI 1980s ?CAD, CABG 1998, cardiac arrest following the procedure ?Ischemic cardiomyopathy ejection fraction 35 to 40% on echocardiogram 2019 ?Cath 11/2015 patent grafts x3 ?hyperlipidemia  ?SVT, adenosine 08/2017 ?Had SVT x 3 total (dec 2018, 08/2017 ( needed adenosine), 11/2017) ?Diabetes ?Chronic shortness of breath with overexertion ?Presenting for follow-up of his coronary disease, shortness of breath, SVT, prior ablation ? ?Last seen in the clinic by one of our providers Jun 2021 ? ?In follow-up today reports that he has had One year of nausea in the Am, 99% in the Am ?Coughing up mucus ?Nose runs all the time ?Daughter reports that he is exhausted after vomiting most mornings ? ?Losing weight, 166 today ?Not as hungry, has crackers in the evening for dinner ? ?Lightheaded when walking at times ?BP low ?Drinks a lot of water ? ?Denies chest pain, no tachypalpitations, no falls ? ?Wife died last year, ?Daughter lives next door ?"lost a lot of strength in legs", has been less active ?Uses a cane, also owns a walker, uses the walker in the house ?No recent falls ? ?EKG personally reviewed by myself on todays visit ?Normal sinus rhythm rate 90 bpm poor R wave progression to the anterior precordial leads unable to exclude old anterior MI, unable to exclude old inferior MI ? ?Other past medical history reviewed ?In the emergency room Jan 2020 for SVT and Dec 2019 ?Denies any recurrence of his SVT since ablation ? ?Last echo 2019 ?Ejection fraction 35 to 40% ? ?Other past medical history reviewed ?emergency  room 01/25/2018 with palpitations.   ? associated with chest pain with radiation to the jaw.  ? His heart rate was in the 160s.   ?vagal maneuvers without success.  ?given 6 mg of adenosine which terminated his SVT.   ? 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 ? no further episodes of SVT since ablation ? ? telemetry strips from July 2019 documenting narrow complex tachycardia rate 169 bpm that broke with adenosine ? ? hospitalization ? new onset of palpitations chest pain and neck fluttering 09/03/2017 ? suddenly when he was at home.  seen by the EMS.   ?Possible SVT,  given adenosine ? resolved within 3 to 5 minutes.   ? ? echocardiogram July 2019  LV dysfunction of 35 to 40% ?cardiac catheterization which showed EF of 40% in 2017 ? ?Remote weight loss from pancreas ?Chronic dizziness, since that time ?Used to be on losartan, medication was weaned off but continues to have orthostasis that is debilitating ?Feels it is affecting his life and making his balance worse ? ?Prior cardiac catheterization results from 2017 ?[place] lesion, 95 %stenosed. ?Mid RCA lesion, 100 %stenosed. ?SVG graft was visualized by angiography and is small. ?Origin to Prox Graft lesion, 50 %stenosed. ?SVG graft was visualized by angiography and is normal in caliber and anatomically normal. ?Post Atrio lesion, 100 %stenosed. ?Ost Cx to Mid Cx lesion, 40 %stenosed. ?Dist RCA lesion, 100 %stenosed. ?Mid LAD lesion, 85 %  stenosed. ?LM lesion, 30 %stenosed. ?Ost 1st Mrg to 1st Mrg lesion, 65 %stenosed. ?LIMA graft was visualized by angiography and is normal in caliber and anatomically normal. ?  ? ?PMH:   has a past medical history of Anginal pain ([place]), Coronary artery disease (1998), Depression, Diabetes mellitus without complication ([place]), History of kidney stones, Hyperlipidemia, Hypertension, Myocardial infarction ([place]), Nephrolithiasis, Obesity, Psoriasis, PUD (peptic ulcer disease), SVT (supraventricular tachycardia) ([place]), and  Wears dentures. ? ?PSH:    ?Past Surgical History:  ?Procedure Laterality Date  ? CARDIAC CATHETERIZATION Left 12/07/2015  ? Procedure: Left Heart Cath and Cors/Grafts Angiography;  Surgeon: Corey Skains, MD;  Location: [place] CV LAB;  Service: Cardiovascular;  Laterality: Left;  ? CATARACT EXTRACTION W/PHACO Left 08/31/2020  ? Procedure: CATARACT EXTRACTION PHACO AND INTRAOCULAR LENS PLACEMENT (IOC) LEFT 5.53 00:38.7;  Surgeon: Birder Robson, MD;  Location: [place];  Service: Ophthalmology;  Laterality: Left;  [place] ?Diabetic - oral meds  ? CATARACT EXTRACTION W/PHACO Right 09/14/2020  ? Procedure: CATARACT EXTRACTION PHACO AND INTRAOCULAR LENS PLACEMENT (IOC) RIGHT 6.87 00:53.5;  Surgeon: Birder Robson, MD;  Location: [place];  Service: Ophthalmology;  Laterality: Right;  [place] ?Diabetic - oral meds  ? CORONARY ARTERY BYPASS GRAFT  1998  ? [facility]  ? ESOPHAGOGASTRODUODENOSCOPY (EGD) WITH PROPOFOL N/A 04/27/2021  ? Procedure: ESOPHAGOGASTRODUODENOSCOPY (EGD) WITH PROPOFOL;  Surgeon: [place], Benay Pike, MD;  Location: [facility] ENDOSCOPY;  Service: Gastroenterology;  Laterality: N/A;  DM  ? SVT ABLATION N/A 03/13/2018  ? Procedure: SVT ABLATION;  Surgeon: Constance Haw, MD;  Location: [place] CV LAB;  Service: Cardiovascular;  Laterality: N/A;  ? ? ?Current Outpatient Medications  ?Medication Sig Dispense Refill  ? allopurinol (ZYLOPRIM) 100 MG tablet Take 100 mg by mouth daily.    ? aspirin EC 81 MG tablet Take 1 tablet (81 mg total) by mouth daily. 90 tablet 0  ? ezetimibe (ZETIA) 10 MG tablet Take 1 tablet (10 mg total) by mouth daily. 90 tablet 3  ? glipiZIDE (GLUCOTROL) 10 MG tablet Take 10 mg by mouth 2 (two) times daily before a meal.    ? metFORMIN (GLUCOPHAGE) 1000 MG tablet Take 1,000 mg by mouth 2 (two) times daily with a meal.     ? metoprolol tartrate (LOPRESSOR) 25 MG tablet Take 25 mg by mouth 2 (two) times daily.    ? omeprazole (PRILOSEC) 20 MG capsule  Take 20 mg by mouth daily.    ? pioglitazone (ACTOS) 15 MG tablet Take 15 mg by mouth daily.    ? rOPINIRole (REQUIP) 0.25 MG tablet Take 1 tablet (0.25 mg total) by mouth at bedtime. 30 tablet 1  ? sertraline (ZOLOFT) 100 MG tablet Take 100 mg by mouth daily.    ? simvastatin (ZOCOR) 40 MG tablet Take 40 mg by mouth every evening.    ? ?No current facility-administered medications for this visit.  ? ? ?Allergies:   Lisinopril, Salsalate, and Ace inhibitors  ? ?Social History:  The patient  reports that he quit smoking about 38 years ago. His smoking use included cigarettes. He has a 37.50 pack-year smoking history. He has never used smokeless tobacco. He reports that he does not drink alcohol and does not use drugs.  ? ?Family History:   family history includes Heart attack in his brother, brother, father, sister, sister, and sister.  ? ? ?Review of Systems: ?Review of Systems  ?Constitutional: Negative.   ?HENT: Negative.    ?Respiratory:  Positive for  shortness of breath.   ?Cardiovascular: Negative.   ?Gastrointestinal: Negative.   ?Musculoskeletal: Negative.   ?Neurological: Negative.   ?Psychiatric/Behavioral: Negative.    ?All other systems reviewed and are negative. ? ?PHYSICAL EXAM: ?VS:  BP 100/60 (BP Location: Left Arm, Patient Position: Sitting, Cuff Size: Normal)   Pulse 90   Ht '5\' 10"'$  (1.778 m)   Wt 75.3 kg   SpO2 97%   BMI 23.82 kg/m?  , BMI Body mass index is 23.82 kg/m?[place] ?Constitutional:  oriented to person, place, and time. No distress.  ?HENT:  ?Head: Grossly normal ?Eyes:  no discharge. No scleral icterus.  ?Neck: No JVD, no carotid bruits  ?Cardiovascular: Regular rate and rhythm, no murmurs appreciated ?Pulmonary/Chest: Clear to auscultation bilaterally, no wheezes or rails ?Abdominal: Soft.  no distension.  no tenderness.  ?Musculoskeletal: Normal range of motion ?Neurological:  normal muscle tone. Coordination normal. No atrophy ?Skin: Skin warm and dry ?Psychiatric: normal affect,  pleasant ? ? ?Recent Labs: ?No results found for requested labs within last 8760 hours.  ? ? ?Lipid Panel ?Lab Results  ?Component Value Date  ? CHOL 158 04/25/2012  ? HDL 77 (H) 04/25/2012  ? [place] 62 04/25/2012

## 2021-05-27 ENCOUNTER — Ambulatory Visit: Payer: Medicare Other | Admitting: Cardiovascular Disease

## 2021-05-27 ENCOUNTER — Encounter: Payer: Self-pay | Admitting: Cardiovascular Disease

## 2021-05-27 VITALS — BP 100/60 | HR 90 | Ht 70.0 in | Wt 166.0 lb

## 2021-05-27 DIAGNOSIS — I10 Essential (primary) hypertension: Secondary | ICD-10-CM

## 2021-05-27 DIAGNOSIS — I4719 Other supraventricular tachycardia: Secondary | ICD-10-CM

## 2021-05-27 DIAGNOSIS — I255 Ischemic cardiomyopathy: Secondary | ICD-10-CM | POA: Diagnosis not present

## 2021-05-27 DIAGNOSIS — I2510 Atherosclerotic heart disease of native coronary artery without angina pectoris: Secondary | ICD-10-CM

## 2021-05-27 DIAGNOSIS — I471 Supraventricular tachycardia, unspecified: Secondary | ICD-10-CM

## 2021-05-27 DIAGNOSIS — R0602 Shortness of breath: Secondary | ICD-10-CM

## 2021-05-27 DIAGNOSIS — I25708 Atherosclerosis of coronary artery bypass graft(s), unspecified, with other forms of angina pectoris: Secondary | ICD-10-CM | POA: Diagnosis not present

## 2021-05-27 DIAGNOSIS — Z951 Presence of aortocoronary bypass graft: Secondary | ICD-10-CM

## 2021-05-27 DIAGNOSIS — E782 Mixed hyperlipidemia: Secondary | ICD-10-CM

## 2021-05-27 MED ORDER — METOPROLOL TARTRATE 25 MG PO TABS: 25.0000 mg | ORAL_TABLET | Freq: Two times a day (BID) | ORAL | 3 refills | Status: DC

## 2021-05-27 NOTE — Patient Instructions (Addendum)
Medication Instructions:  ?Try extra omeprazole (2 pills at night) for nausea ?Sleep on a wedge pill, head up ? ?Hold the zetia/ezetimibe for a few days and see if that helps nausea ? ?Try fluticasone nasal spray  (OTC), ?Am and pm ? ?If you need a refill on your cardiac medications before your next appointment, please call your pharmacy.  ? ?Lab work: ?No new labs needed ? ?Testing/Procedures: ?No new testing needed ? ?Follow-Up: ?At [facility], you and your health needs are our priority.  As part of our continuing mission to provide you with exceptional heart care, we have created designated Provider Care Teams.  These Care Teams include your primary Cardiologist (physician) and Advanced Practice Providers (APPs -  Physician Assistants and Nurse Practitioners) who all work together to provide you with the care you need, when you need it. ? ?You will need a follow up appointment in 12 months ? ?Providers on your designated Care Team:   ?Murray Hodgkins, NP ?Christell Faith, PA-C ?Cadence Kathlen Mody, PA-C ? ?COVID-19 Vaccine Information can be found at: [email or web address] For questions related to vaccine distribution or appointments, please email [email or web address] or call [phone].  ? ?

## 2021-10-13 ENCOUNTER — Other Ambulatory Visit: Payer: Self-pay | Admitting: Specialist

## 2021-10-13 DIAGNOSIS — J849 Interstitial pulmonary disease, unspecified: Secondary | ICD-10-CM

## 2021-10-13 DIAGNOSIS — R0602 Shortness of breath: Secondary | ICD-10-CM

## 2021-10-25 ENCOUNTER — Other Ambulatory Visit: Payer: Self-pay | Admitting: Specialist

## 2021-10-25 DIAGNOSIS — R0602 Shortness of breath: Secondary | ICD-10-CM

## 2021-10-25 DIAGNOSIS — J849 Interstitial pulmonary disease, unspecified: Secondary | ICD-10-CM

## 2021-11-03 ENCOUNTER — Other Ambulatory Visit: Payer: Self-pay | Admitting: Specialist

## 2021-11-03 ENCOUNTER — Ambulatory Visit
Admission: RE | Admit: 2021-11-03 | Discharge: 2021-11-03 | Disposition: A | Discharge status: Home / Self Care | Payer: Medicare Other | Source: Ambulatory Visit | Attending: Specialist | Admitting: Specialist

## 2021-11-03 DIAGNOSIS — R0602 Shortness of breath: Secondary | ICD-10-CM | POA: Insufficient documentation

## 2021-11-03 DIAGNOSIS — J849 Interstitial pulmonary disease, unspecified: Secondary | ICD-10-CM | POA: Insufficient documentation

## 2022-04-04 ENCOUNTER — Ambulatory Visit
Admission: RE | Admit: 2022-04-04 | Discharge: 2022-04-04 | Disposition: A | Discharge status: Home / Self Care | Payer: Medicare Other | Source: Ambulatory Visit | Attending: Family Medicine | Admitting: Family Medicine

## 2022-04-04 ENCOUNTER — Other Ambulatory Visit: Payer: Self-pay | Admitting: Family Medicine

## 2022-04-04 DIAGNOSIS — M25511 Pain in right shoulder: Secondary | ICD-10-CM

## 2022-04-10 ENCOUNTER — Ambulatory Visit: Payer: Medicare Other | Admitting: Dermatology

## 2022-04-10 ENCOUNTER — Other Ambulatory Visit: Payer: Self-pay | Admitting: Dermatology

## 2022-04-10 DIAGNOSIS — D485 Neoplasm of uncertain behavior of skin: Secondary | ICD-10-CM

## 2022-04-10 DIAGNOSIS — L821 Other seborrheic keratosis: Secondary | ICD-10-CM

## 2022-04-10 DIAGNOSIS — C44320 Squamous cell carcinoma of skin of unspecified parts of face: Secondary | ICD-10-CM

## 2022-04-10 DIAGNOSIS — L820 Inflamed seborrheic keratosis: Secondary | ICD-10-CM | POA: Diagnosis not present

## 2022-04-10 DIAGNOSIS — C44329 Squamous cell carcinoma of skin of other parts of face: Secondary | ICD-10-CM

## 2022-04-10 DIAGNOSIS — L570 Actinic keratosis: Secondary | ICD-10-CM

## 2022-04-10 DIAGNOSIS — C4492 Squamous cell carcinoma of skin, unspecified: Secondary | ICD-10-CM

## 2022-04-10 DIAGNOSIS — L578 Other skin changes due to chronic exposure to nonionizing radiation: Secondary | ICD-10-CM

## 2022-04-10 HISTORY — DX: Squamous cell carcinoma of skin, unspecified: C44.92

## 2022-04-10 NOTE — Patient Instructions (Signed)
Electrodesiccation and Curettage ("Scrape and Burn") Wound Care Instructions  Leave the original bandage on for 24 hours if possible.  If the bandage becomes soaked or soiled before that time, it is OK to remove it and examine the wound.  A small amount of post-operative bleeding is normal.  If excessive bleeding occurs, remove the bandage, place gauze over the site and apply continuous pressure (no peeking) over the area for 30 minutes. If this does not work, please call our clinic as soon as possible or page your doctor if it is after hours.   Once a day, cleanse the wound with soap and water. It is fine to shower. If a thick crust develops you may use a Q-tip dipped into dilute hydrogen peroxide (mix 1:1 with water) to dissolve it.  Hydrogen peroxide can slow the healing process, so use it only as needed.    After washing, apply petroleum jelly (Vaseline) or an antibiotic ointment if your doctor prescribed one for you, followed by a bandage.    For best healing, the wound should be covered with a layer of ointment at all times. If you are not able to keep the area covered with a bandage to hold the ointment in place, this may mean re-applying the ointment several times a day.  Continue this wound care until the wound has healed and is no longer open. It may take several weeks for the wound to heal and close.  Itching and mild discomfort is normal during the healing process.  If you have any discomfort, you can take Tylenol (acetaminophen) or ibuprofen as directed on the bottle. (Please do not take these if you have an allergy to them or cannot take them for another reason).  Some redness, tenderness and white or yellow material in the wound is normal healing.  If the area becomes very sore and red, or develops a thick yellow-green material (pus), it may be infected; please notify us.    Wound healing continues for up to one year following surgery. It is not unusual to experience pain in the scar  from time to time during the interval.  If the pain becomes severe or the scar thickens, you should notify the office.    A slight amount of redness in a scar is expected for the first six months.  After six months, the redness will fade and the scar will soften and fade.  The color difference becomes less noticeable with time.  If there are any problems, return for a post-op surgery check at your earliest convenience.  To improve the appearance of the scar, you can use silicone scar gel, cream, or sheets (such as Mederma or Serica) every night for up to one year. These are available over the counter (without a prescription).  Please call our office at [phone] for any questions or concerns.     Due to recent changes in healthcare laws, you may see results of your pathology and/or laboratory studies on MyChart before the doctors have had a chance to review them. We understand that in some cases there may be results that are confusing or concerning to you. Please understand that not all results are received at the same time and often the doctors may need to interpret multiple results in order to provide you with the best plan of care or course of treatment. Therefore, we ask that you please give us 2 business days to thoroughly review all your results before contacting the office for clarification. Should   we see a critical lab result, you will be contacted sooner.   If You Need Anything After Your Visit  If you have any questions or concerns for your doctor, please call our main line at [phone] and press option 4 to reach your doctor's medical assistant. If no one answers, please leave a voicemail as directed and we will return your call as soon as possible. Messages left after 4 pm will be answered the following business day.   You may also send us a message via MyChart. We typically respond to MyChart messages within 1-2 business days.  For prescription refills, please ask your  pharmacy to contact our office. Our fax number is [phone].  If you have an urgent issue when the clinic is closed that cannot wait until the next business day, you can page your doctor at the number below.    Please note that while we do our best to be available for urgent issues outside of office hours, we are not available 24/7.   If you have an urgent issue and are unable to reach us, you may choose to seek medical care at your doctor's office, retail clinic, urgent care center, or emergency room.  If you have a medical emergency, please immediately call 911 or go to the emergency department.  Pager Numbers  - Dr. Kowalski: [phone]  - Dr. Moye: [phone]  - Dr. Stewart: [phone]  In the event of inclement weather, please call our main line at [phone] for an update on the status of any delays or closures.  Dermatology Medication Tips: Please keep the boxes that topical medications come in in order to help keep track of the instructions about where and how to use these. Pharmacies typically print the medication instructions only on the boxes and not directly on the medication tubes.   If your medication is too expensive, please contact our office at [phone] option 4 or send us a message through MyChart.   We are unable to tell what your co-pay for medications will be in advance as this is different depending on your insurance coverage. However, we may be able to find a substitute medication at lower cost or fill out paperwork to get insurance to cover a needed medication.   If a prior authorization is required to get your medication covered by your insurance company, please allow us 1-2 business days to complete this process.  Drug prices often vary depending on where the prescription is filled and some pharmacies may offer cheaper prices.  The website www.goodrx.com contains coupons for medications through different pharmacies. The prices here do not  account for what the cost may be with help from insurance (it may be cheaper with your insurance), but the website can give you the price if you did not use any insurance.  - You can print the associated coupon and take it with your prescription to the pharmacy.  - You may also stop by our office during regular business hours and pick up a GoodRx coupon card.  - If you need your prescription sent electronically to a different pharmacy, notify our office through [facility] MyChart or by phone at [phone] option 4.     Si Usted Necesita Algo Despus de Su Visita  Tambin puede enviarnos un mensaje a travs de MyChart. Por lo general respondemos a los mensajes de MyChart en el transcurso de 1 a 2 das hbiles.  Para renovar recetas, por favor pida a su farmacia que se ponga en   contacto con nuestra oficina. Nuestro nmero de fax es el [phone].  Si tiene un asunto urgente cuando la clnica est cerrada y que no puede esperar hasta el siguiente da hbil, puede llamar/localizar a su doctor(a) al nmero que aparece a continuacin.   Por favor, tenga en cuenta que aunque hacemos todo lo posible para estar disponibles para asuntos urgentes fuera del horario de oficina, no estamos disponibles las 24 horas del da, los 7 das de la semana.   Si tiene un problema urgente y no puede comunicarse con nosotros, puede optar por buscar atencin mdica  en el consultorio de su doctor(a), en una clnica privada, en un centro de atencin urgente o en una sala de emergencias.  Si tiene una emergencia mdica, por favor llame inmediatamente al 911 o vaya a la sala de emergencias.  Nmeros de bper  - Dr. Kowalski: [phone]  - Dra. Moye: [phone]  - Dra. Stewart: [phone]  En caso de inclemencias del tiempo, por favor llame a nuestra lnea principal al [phone] para una actualizacin sobre el estado de cualquier retraso o cierre.  Consejos para la medicacin en dermatologa: Por  favor, guarde las cajas en las que vienen los medicamentos de uso tpico para ayudarle a seguir las instrucciones sobre dnde y cmo usarlos. Las farmacias generalmente imprimen las instrucciones del medicamento slo en las cajas y no directamente en los tubos del medicamento.   Si su medicamento es muy caro, por favor, pngase en contacto con nuestra oficina llamando al [phone] y presione la opcin 4 o envenos un mensaje a travs de MyChart.   No podemos decirle cul ser su copago por los medicamentos por adelantado ya que esto es diferente dependiendo de la cobertura de su seguro. Sin embargo, es posible que podamos encontrar un medicamento sustituto a menor costo o llenar un formulario para que el seguro cubra el medicamento que se considera necesario.   Si se requiere una autorizacin previa para que su compaa de seguros cubra su medicamento, por favor permtanos de 1 a 2 das hbiles para completar este proceso.  Los precios de los medicamentos varan con frecuencia dependiendo del lugar de dnde se surte la receta y alguna farmacias pueden ofrecer precios ms baratos.  El sitio web www.goodrx.com tiene cupones para medicamentos de diferentes farmacias. Los precios aqu no tienen en cuenta lo que podra costar con la ayuda del seguro (puede ser ms barato con su seguro), pero el sitio web puede darle el precio si no utiliz ningn seguro.  - Puede imprimir el cupn correspondiente y llevarlo con su receta a la farmacia.  - Tambin puede pasar por nuestra oficina durante el horario de atencin regular y recoger una tarjeta de cupones de GoodRx.  - Si necesita que su receta se enve electrnicamente a una farmacia diferente, informe a nuestra oficina a travs de MyChart de [facility] o por telfono llamando al [phone] y presione la opcin 4.  

## 2022-04-10 NOTE — Progress Notes (Signed)
New Patient Visit  Subjective  Johnny [name] is a 82 y.o. male who presents for the following: Irregular skin lesion (On the R cheek has been there for about 2 months, growing and changing, patient has a hx of skin cancer which was treated by Dr. Lacinda Axon with Mohs surgery ). The patient has spots, moles and lesions to be evaluated, some may be new or changing and the patient has concerns that these could be cancer.  The following portions of the chart were reviewed this encounter and updated as appropriate:   Tobacco  Allergies  Meds  Problems  Med Hx  Surg Hx  Fam Hx     Review of Systems:  No other skin or systemic complaints except as noted in HPI or Assessment and Plan.  Objective  Well appearing patient in no apparent distress; mood and affect are within normal limits.  A focused examination was performed including the face, scalp, and ears. Relevant physical exam findings are noted in the Assessment and Plan.  R cheek 1.5 cm Hyperkeratotic papule.  R ear sup helix x 1 Erythematous stuck-on, waxy papule or plaque  L inf helix x 1, scalp x 5 (6) Erythematous thin papules/macules with gritty scale.    Assessment & Plan  Neoplasm of uncertain behavior of skin R cheek Epidermal / dermal shaving  Lesion diameter (cm):  1.5 Informed consent: discussed and consent obtained   Timeout: patient name, date of birth, surgical site, and procedure verified   Procedure prep:  Patient was prepped and draped in usual sterile fashion Prep type:  Isopropyl alcohol Anesthesia: the lesion was anesthetized in a standard fashion   Anesthetic:  1% lidocaine w/ epinephrine 1-100,000 buffered w/ 8.4% NaHCO3 Instrument used: flexible razor blade   Hemostasis achieved with: pressure, aluminum chloride and electrodesiccation   Outcome: patient tolerated procedure well   Post-procedure details: sterile dressing applied and wound care instructions given   Dressing type: bandage and petrolatum     Destruction of lesion Complexity: simple   Destruction method: cryotherapy   Informed consent: discussed and consent obtained   Timeout:  patient name, date of birth, surgical site, and procedure verified Lesion destroyed using liquid nitrogen: Yes   Region frozen until ice ball extended beyond lesion: Yes   Lesion length (cm):  1.5 Lesion width (cm):  1.5 Margin per side (cm):  0.2 Final wound size (cm):  1.9 Outcome: patient tolerated procedure well with no complications   Post-procedure details: wound care instructions given    Specimen 1 - Surgical pathology Differential Diagnosis: D48.5 r/o SCC ED&C today Check Margins: No  Inflamed seborrheic keratosis R ear sup helix x 1 Symptomatic, irritating, patient would like treated. Destruction of lesion - R ear sup helix x 1 Complexity: simple   Destruction method: cryotherapy   Informed consent: discussed and consent obtained   Timeout:  patient name, date of birth, surgical site, and procedure verified Lesion destroyed using liquid nitrogen: Yes   Region frozen until ice ball extended beyond lesion: Yes   Outcome: patient tolerated procedure well with no complications   Post-procedure details: wound care instructions given    AK (actinic keratosis) (6) L inf helix x 1, scalp x 5 Destruction of lesion - L inf helix x 1, scalp x 5 Complexity: simple   Destruction method: cryotherapy   Informed consent: discussed and consent obtained   Timeout:  patient name, date of birth, surgical site, and procedure verified Lesion destroyed using liquid nitrogen:  Yes   Region frozen until ice ball extended beyond lesion: Yes   Outcome: patient tolerated procedure well with no complications   Post-procedure details: wound care instructions given    Actinic Damage - chronic, secondary to cumulative UV radiation exposure/sun exposure over time - diffuse scaly erythematous macules with underlying dyspigmentation - Recommend daily  broad spectrum sunscreen SPF 30+ to sun-exposed areas, reapply every 2 hours as needed.  - Recommend staying in the shade or wearing long sleeves, sun glasses (UVA+UVB protection) and wide brim hats (4-inch brim around the entire circumference of the hat). - Call for new or changing lesions.  Seborrheic Keratoses - Stuck-on, waxy, tan-brown papules and/or plaques  - Benign-appearing - Discussed benign etiology and prognosis. - Observe - Call for any changes  Return in about 3 months (around 07/09/2022).  Luther Redo, CMA, am acting as scribe for Sarina Ser, MD . Documentation: I have reviewed the above documentation for accuracy and completeness, and I agree with the above.  Sarina Ser, MD

## 2022-04-11 ENCOUNTER — Encounter: Payer: Self-pay | Admitting: Dermatology

## 2022-04-12 ENCOUNTER — Telehealth: Payer: Self-pay

## 2022-04-12 NOTE — Telephone Encounter (Signed)
-----   Message from Ralene Bathe, MD sent at 04/11/2022  4:35 PM EST ----- Diagnosis Skin , right cheek SQUAMOUS CELL CARCINOMA, KERATOACANTHOMA TYPE  Cancer - SCC Already treated Recheck next visit

## 2022-04-12 NOTE — Telephone Encounter (Signed)
Advised patients daughter of bx results./sh

## 2022-07-12 ENCOUNTER — Ambulatory Visit: Payer: Medicare Other | Admitting: Dermatology

## 2022-07-12 VITALS — BP 105/71

## 2022-07-12 DIAGNOSIS — L570 Actinic keratosis: Secondary | ICD-10-CM | POA: Diagnosis not present

## 2022-07-12 DIAGNOSIS — Z85828 Personal history of other malignant neoplasm of skin: Secondary | ICD-10-CM

## 2022-07-12 DIAGNOSIS — L578 Other skin changes due to chronic exposure to nonionizing radiation: Secondary | ICD-10-CM

## 2022-07-12 DIAGNOSIS — Z7189 Other specified counseling: Secondary | ICD-10-CM

## 2022-07-12 DIAGNOSIS — W908XXA Exposure to other nonionizing radiation, initial encounter: Secondary | ICD-10-CM

## 2022-07-12 DIAGNOSIS — L820 Inflamed seborrheic keratosis: Secondary | ICD-10-CM

## 2022-07-12 DIAGNOSIS — Z8589 Personal history of malignant neoplasm of other organs and systems: Secondary | ICD-10-CM

## 2022-07-12 DIAGNOSIS — H61002 Unspecified perichondritis of left external ear: Secondary | ICD-10-CM

## 2022-07-12 DIAGNOSIS — L249 Irritant contact dermatitis, unspecified cause: Secondary | ICD-10-CM

## 2022-07-12 DIAGNOSIS — X32XXXA Exposure to sunlight, initial encounter: Secondary | ICD-10-CM

## 2022-07-12 MED ORDER — TACROLIMUS 0.1 % EX OINT: TOPICAL_OINTMENT | CUTANEOUS | 2 refills | Status: DC

## 2022-07-12 NOTE — Progress Notes (Addendum)
Follow-Up Visit   Subjective  Johnny [name] is a 82 y.o. male who presents for the following: AK 26m f/u, ears, scalp, hx of SCC R cheek, bx and EDC 04/10/22, check spot R neck, 59m, used TMC 0.5% cr and ran out, check spots scalp, L ear The patient has spots, moles and lesions to be evaluated, some may be new or changing and the patient may have concern these could be cancer.  Patient accompanied by daughter.  The following portions of the chart were reviewed this encounter and updated as appropriate: medications, allergies, medical history  Review of Systems:  No other skin or systemic complaints except as noted in HPI or Assessment and Plan.  Objective  Well appearing patient in no apparent distress; mood and affect are within normal limits.  A focused examination was performed of the following areas: Face, scalp, ears  Relevant exam findings are noted in the Assessment and Plan.  Scalp x 2, R temple x 1 (4) Pink scaly macules  Left Ear x 1 Scaly macule     Scalp x 2 (2) Stuck on waxy paps with erythema    Assessment & Plan   HISTORY OF SQUAMOUS CELL CARCINOMA OF THE SKIN - No evidence of recurrence today - No lymphadenopathy - Recommend regular full body skin exams - Recommend daily broad spectrum sunscreen SPF 30+ to sun-exposed areas, reapply every 2 hours as needed.  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  Trauma or pressure from sleeping on the ear or from cell phone use and sun damage may be exacerbating factors.  Treatment may include using a C-shaped airplane neck pillow for sleeping on the side of the head so no pressure is on the ear.  Other treatments include topical or intralesional steroids; liquid nitrogen or laser destruction; shave removal or excision.  The condition can be difficult to treat and persist or recur despite treatment.  Treatment Plan: LN2 Left Ear x 1 CNCH With AK  Destruction of lesion - Scalp x 2, R temple x 1; Left Ear x 1 Complexity: simple   Destruction method: cryotherapy   Informed consent: discussed and consent obtained   Timeout:  patient name, date of birth, surgical site, and procedure verified Lesion destroyed using liquid nitrogen: Yes   Region frozen until ice ball extended beyond lesion: Yes   Outcome: patient tolerated procedure well with no complications   Post-procedure details: wound care instructions given    Inflamed seborrheic keratosis (2) Scalp x 2  Symptomatic, irritating, patient would like treated.   Destruction of lesion - Scalp x 2 Complexity: simple   Destruction method: cryotherapy   Informed consent: discussed and consent obtained   Timeout:  patient name, date of birth, surgical site, and procedure verified Lesion destroyed using liquid nitrogen: Yes   Region frozen until ice ball extended beyond lesion: Yes   Outcome: patient tolerated  procedure well with no complications   Post-procedure details: wound care instructions given    ACTINIC DAMAGE - chronic, secondary to cumulative UV radiation exposure/sun exposure over time - diffuse scaly erythematous macules with underlying dyspigmentation - Recommend daily broad spectrum sunscreen SPF 30+ to sun-exposed areas, reapply every 2 hours as needed.  - Recommend staying in the shade or wearing long sleeves, sun  glasses (UVA+UVB protection) and wide brim hats (4-inch brim around the entire circumference of the hat). - Call for new or changing lesions.  Return in about 3 months (around 10/12/2022) for AK f/u, CNCH f/u, Irritant vs Eczema f/u.  I, Ardis Rowan, RMA, am acting as scribe for Armida Sans, MD .  Documentation: I have reviewed the above documentation for accuracy and completeness, and I agree with the above.  Armida Sans, MD

## 2022-07-12 NOTE — Patient Instructions (Addendum)
Cryotherapy Aftercare  Wash gently with soap and water everyday.   Apply Vaseline and Band-Aid daily until healed.     Due to recent changes in healthcare laws, you may see results of your pathology and/or laboratory studies on MyChart before the doctors have had a chance to review them. We understand that in some cases there may be results that are confusing or concerning to you. Please understand that not all results are received at the same time and often the doctors may need to interpret multiple results in order to provide you with the best plan of care or course of treatment. Therefore, we ask that you please give us 2 business days to thoroughly review all your results before contacting the office for clarification. Should we see a critical lab result, you will be contacted sooner.   If You Need Anything After Your Visit  If you have any questions or concerns for your doctor, please call our main line at [phone] and press option 4 to reach your doctor's medical assistant. If no one answers, please leave a voicemail as directed and we will return your call as soon as possible. Messages left after 4 pm will be answered the following business day.   You may also send us a message via MyChart. We typically respond to MyChart messages within 1-2 business days.  For prescription refills, please ask your pharmacy to contact our office. Our fax number is [phone].  If you have an urgent issue when the clinic is closed that cannot wait until the next business day, you can page your doctor at the number below.    Please note that while we do our best to be available for urgent issues outside of office hours, we are not available 24/7.   If you have an urgent issue and are unable to reach us, you may choose to seek medical care at your doctor's office, retail clinic, urgent care center, or emergency room.  If you have a medical emergency, please immediately call 911 or go to the  emergency department.  Pager Numbers  - Dr. Kowalski: [phone]  - Dr. Moye: [phone]  - Dr. Stewart: [phone]  In the event of inclement weather, please call our main line at [phone] for an update on the status of any delays or closures.  Dermatology Medication Tips: Please keep the boxes that topical medications come in in order to help keep track of the instructions about where and how to use these. Pharmacies typically print the medication instructions only on the boxes and not directly on the medication tubes.   If your medication is too expensive, please contact our office at [phone] option 4 or send us a message through MyChart.   We are unable to tell what your co-pay for medications will be in advance as this is different depending on your insurance coverage. However, we may be able to find a substitute medication at lower cost or fill out paperwork to get insurance to cover a needed medication.   If a prior authorization is required to get your medication covered by your insurance company, please allow us 1-2 business days to complete this process.  Drug prices often vary depending on where the prescription is filled and some pharmacies may offer cheaper prices.  The website www.goodrx.com contains coupons for medications through different pharmacies. The prices here do not account for what the cost may be with help from insurance (it may be cheaper with your insurance), but the website can   give you the price if you did not use any insurance.  - You can print the associated coupon and take it with your prescription to the pharmacy.  - You may also stop by our office during regular business hours and pick up a GoodRx coupon card.  - If you need your prescription sent electronically to a different pharmacy, notify our office through [place] MyChart or by phone at [phone] option 4.     Si Usted Necesita Algo Despus de Su Visita  Tambin puede  enviarnos un mensaje a travs de MyChart. Por lo general respondemos a los mensajes de MyChart en el transcurso de 1 a 2 das hbiles.  Para renovar recetas, por favor pida a su farmacia que se ponga en contacto con nuestra oficina. Nuestro nmero de fax es el [phone].  Si tiene un asunto urgente cuando la clnica est cerrada y que no puede esperar hasta el siguiente da hbil, puede llamar/localizar a su doctor(a) al nmero que aparece a continuacin.   Por favor, tenga en cuenta que aunque hacemos todo lo posible para estar disponibles para asuntos urgentes fuera del horario de oficina, no estamos disponibles las 24 horas del da, los 7 das de la semana.   Si tiene un problema urgente y no puede comunicarse con nosotros, puede optar por buscar atencin mdica  en el consultorio de su doctor(a), en una clnica privada, en un centro de atencin urgente o en una sala de emergencias.  Si tiene una emergencia mdica, por favor llame inmediatamente al 911 o vaya a la sala de emergencias.  Nmeros de bper  - Dr. Kowalski: [phone]  - Dra. Moye: [phone]  - Dra. Stewart: [phone]  En caso de inclemencias del tiempo, por favor llame a nuestra lnea principal al [phone] para una actualizacin sobre el estado de cualquier retraso o cierre.  Consejos para la medicacin en dermatologa: Por favor, guarde las cajas en las que vienen los medicamentos de uso tpico para ayudarle a seguir las instrucciones sobre dnde y cmo usarlos. Las farmacias generalmente imprimen las instrucciones del medicamento slo en las cajas y no directamente en los tubos del medicamento.   Si su medicamento es muy caro, por favor, pngase en contacto con nuestra oficina llamando al [phone] y presione la opcin 4 o envenos un mensaje a travs de MyChart.   No podemos decirle cul ser su copago por los medicamentos por adelantado ya que esto es diferente dependiendo de la cobertura de su seguro.  Sin embargo, es posible que podamos encontrar un medicamento sustituto a menor costo o llenar un formulario para que el seguro cubra el medicamento que se considera necesario.   Si se requiere una autorizacin previa para que su compaa de seguros cubra su medicamento, por favor permtanos de 1 a 2 das hbiles para completar este proceso.  Los precios de los medicamentos varan con frecuencia dependiendo del lugar de dnde se surte la receta y alguna farmacias pueden ofrecer precios ms baratos.  El sitio web www.goodrx.com tiene cupones para medicamentos de diferentes farmacias. Los precios aqu no tienen en cuenta lo que podra costar con la ayuda del seguro (puede ser ms barato con su seguro), pero el sitio web puede darle el precio si no utiliz ningn seguro.  - Puede imprimir el cupn correspondiente y llevarlo con su receta a la farmacia.  - Tambin puede pasar por nuestra oficina durante el horario de atencin regular y recoger una tarjeta de cupones de GoodRx.  -   Si necesita que su receta se enve electrnicamente a una farmacia diferente, informe a nuestra oficina a travs de MyChart de [place] o por telfono llamando al [phone] y presione la opcin 4.  

## 2022-07-18 ENCOUNTER — Telehealth: Payer: Self-pay

## 2022-07-18 NOTE — Telephone Encounter (Signed)
Male contacted the office stating Tacrolimus was over $600, and they would like an alternative sent in. [place], [place]. Please advise.

## 2022-07-19 ENCOUNTER — Other Ambulatory Visit: Payer: Self-pay

## 2022-07-19 MED ORDER — STRATA GRT EX GEL: CUTANEOUS | 1 refills | Status: AC

## 2022-07-19 NOTE — Progress Notes (Signed)
I spoke with [facility] and they said the Tacrolimus ointment is covered, but patient would still have a $100 co-pay. I spoke with patient and that isn't an affordable option for him so we discussed sending in [place] GRT, and if too expensive he should contact us for a different alternative.

## 2022-07-21 ENCOUNTER — Encounter: Payer: Self-pay | Admitting: Dermatology

## 2022-08-01 NOTE — Addendum Note (Signed)
Addended by: Deirdre Evener on: 08/01/2022 09:33 AM   Modules accepted: Orders

## 2022-10-18 ENCOUNTER — Ambulatory Visit: Payer: Medicare Other | Admitting: Dermatology

## 2022-10-18 ENCOUNTER — Encounter: Payer: Self-pay | Admitting: Dermatology

## 2022-10-18 VITALS — BP 101/64 | HR 90

## 2022-10-18 DIAGNOSIS — L570 Actinic keratosis: Secondary | ICD-10-CM

## 2022-10-18 DIAGNOSIS — W908XXA Exposure to other nonionizing radiation, initial encounter: Secondary | ICD-10-CM | POA: Diagnosis not present

## 2022-10-18 DIAGNOSIS — L578 Other skin changes due to chronic exposure to nonionizing radiation: Secondary | ICD-10-CM | POA: Diagnosis not present

## 2022-10-18 DIAGNOSIS — L259 Unspecified contact dermatitis, unspecified cause: Secondary | ICD-10-CM

## 2022-10-18 DIAGNOSIS — Z79899 Other long term (current) drug therapy: Secondary | ICD-10-CM

## 2022-10-18 DIAGNOSIS — L249 Irritant contact dermatitis, unspecified cause: Secondary | ICD-10-CM | POA: Diagnosis not present

## 2022-10-18 DIAGNOSIS — L820 Inflamed seborrheic keratosis: Secondary | ICD-10-CM

## 2022-10-18 DIAGNOSIS — Z7189 Other specified counseling: Secondary | ICD-10-CM

## 2022-10-18 MED ORDER — EUCRISA 2 % EX OINT: TOPICAL_OINTMENT | CUTANEOUS | 2 refills | Status: DC

## 2022-10-18 NOTE — Patient Instructions (Addendum)
Start Eucrisa ointment - apply topically to right neck daily to twice daily until clear and then as needed if flares  Sample of opzelura given - apply topically to right neck daily to twice daily until clear and then as needed flares        Seborrheic Keratosis  What causes seborrheic keratoses? Seborrheic keratoses are harmless, common skin growths that first appear during adult life.  As time goes by, more growths appear.  Some people may develop a large number of them.  Seborrheic keratoses appear on both covered and uncovered body parts.  They are not caused by sunlight.  The tendency to develop seborrheic keratoses can be inherited.  They vary in color from skin-colored to gray, brown, or even black.  They can be either smooth or have a rough, warty surface.   Seborrheic keratoses are superficial and look as if they were stuck on the skin.  Under the microscope this type of keratosis looks like layers upon layers of skin.  That is why at times the top layer may seem to fall off, but the rest of the growth remains and re-grows.    Treatment Seborrheic keratoses do not need to be treated, but can easily be removed in the office.  Seborrheic keratoses often cause symptoms when they rub on clothing or jewelry.  Lesions can be in the way of shaving.  If they become inflamed, they can cause itching, soreness, or burning.  Removal of a seborrheic keratosis can be accomplished by freezing, burning, or surgery. If any spot bleeds, scabs, or grows rapidly, please return to have it checked, as these can be an indication of a skin cancer.   Actinic keratoses are precancerous spots that appear secondary to cumulative UV radiation exposure/sun exposure over time. They are chronic with expected duration over 1 year. A portion of actinic keratoses will progress to squamous cell carcinoma of the skin. It is not possible to reliably predict which spots will progress to skin cancer and so treatment is  recommended to prevent development of skin cancer.  Recommend daily broad spectrum sunscreen SPF 30+ to sun-exposed areas, reapply every 2 hours as needed.  Recommend staying in the shade or wearing long sleeves, sun glasses (UVA+UVB protection) and wide brim hats (4-inch brim around the entire circumference of the hat). Call for new or changing lesions.   Cryotherapy Aftercare  Wash gently with soap and water everyday.   Apply Vaseline and Band-Aid daily until healed.      Due to recent changes in healthcare laws, you may see results of your pathology and/or laboratory studies on MyChart before the doctors have had a chance to review them. We understand that in some cases there may be results that are confusing or concerning to you. Please understand that not all results are received at the same time and often the doctors may need to interpret multiple results in order to provide you with the best plan of care or course of treatment. Therefore, we ask that you please give [place] 2 business days to thoroughly review all your results before contacting the office for clarification. Should we see a critical lab result, you will be contacted sooner.   If You Need Anything After Your Visit  If you have any questions or concerns for your doctor, please call our main line at [phone] and press option 4 to reach your doctor's medical assistant. If no one answers, please leave a voicemail as directed and we will return your  call as soon as possible. Messages left after 4 pm will be answered the following business day.   You may also send [place] a message via MyChart. We typically respond to MyChart messages within 1-2 business days.  For prescription refills, please ask your pharmacy to contact our office. Our fax number is [phone].  If you have an urgent issue when the clinic is closed that cannot wait until the next business day, you can page your doctor at the number below.    Please note that  while we do our best to be available for urgent issues outside of office hours, we are not available 24/7.   If you have an urgent issue and are unable to reach [place], you may choose to seek medical care at your doctor's office, retail clinic, urgent care center, or emergency room.  If you have a medical emergency, please immediately call 911 or go to the emergency department.  Pager Numbers  - Dr. Gwen Pounds: [phone]  - Dr. Roseanne Reno: [phone]  - Dr. Katrinka Blazing: [phone]   In the event of inclement weather, please call our main line at [phone] for an update on the status of any delays or closures.  Dermatology Medication Tips: Please keep the boxes that topical medications come in in order to help keep track of the instructions about where and how to use these. Pharmacies typically print the medication instructions only on the boxes and not directly on the medication tubes.   If your medication is too expensive, please contact our office at [phone] option 4 or send [place] a message through MyChart.   We are unable to tell what your co-pay for medications will be in advance as this is different depending on your insurance coverage. However, we may be able to find a substitute medication at lower cost or fill out paperwork to get insurance to cover a needed medication.   If a prior authorization is required to get your medication covered by your insurance company, please allow [place] 1-2 business days to complete this process.  Drug prices often vary depending on where the prescription is filled and some pharmacies may offer cheaper prices.  The website www.goodrx.com contains coupons for medications through different pharmacies. The prices here do not account for what the cost may be with help from insurance (it may be cheaper with your insurance), but the website can give you the price if you did not use any insurance.  - You can print the associated coupon and take it with your  prescription to the pharmacy.  - You may also stop by our office during regular business hours and pick up a GoodRx coupon card.  - If you need your prescription sent electronically to a different pharmacy, notify our office through [facility] or by phone at [phone] option 4.     Si Usted Necesita Algo Despus de Su Visita  Tambin puede enviarnos un mensaje a travs de Clinical cytogeneticist. Por lo general respondemos a los mensajes de MyChart en el transcurso de 1 a 2 das hbiles.  Para renovar recetas, por favor pida a su farmacia que se ponga en contacto con nuestra oficina. Annie Sable de fax es [place] [phone].  Si tiene un asunto urgente cuando la clnica est cerrada y que no puede esperar hasta el siguiente da hbil, puede llamar/localizar a su doctor(a) al nmero que aparece a continuacin.   Por favor, tenga en cuenta que aunque hacemos todo lo posible para estar disponibles para asuntos urgentes  fuera del horario de oficina, no estamos disponibles las 24 horas del da, los 7 [address]  [address] de la [place].   Si tiene un problema urgente y no puede comunicarse con nosotros, puede optar por buscar atencin mdica  en el consultorio de su doctor(a), en una clnica privada, en un centro de atencin urgente o en una sala de emergencias.  Si tiene Engineer, drilling, por favor llame inmediatamente al 911 o vaya a la sala de emergencias.  Nmeros de bper  - Dr. Gwen Pounds: [phone]  - Dra. Roseanne Reno: [phone]  - Dr. Katrinka Blazing: [phone]   En caso de inclemencias del tiempo, por favor llame a Lacy Duverney principal al [phone] para una actualizacin sobre el [place] de cualquier retraso o cierre.  Consejos para la medicacin en dermatologa: Por favor, guarde las cajas en las que vienen los medicamentos de uso tpico para ayudarle a seguir las instrucciones sobre dnde y cmo usarlos. Las farmacias generalmente imprimen las instrucciones del medicamento slo en las cajas y no  directamente en los tubos del [place].   Si su medicamento es muy caro, por favor, pngase en contacto con Rolm Gala llamando al [phone] y presione la opcin 4 o envenos un mensaje a travs de Clinical cytogeneticist.   No podemos decirle cul ser su copago por los medicamentos por adelantado ya que esto es diferente dependiendo de la cobertura de su seguro. Sin embargo, es posible que podamos encontrar un medicamento sustituto a Audiological scientist un formulario para que el seguro cubra el medicamento que se considera necesario.   Si se requiere una autorizacin previa para que su compaa de seguros [place] su medicamento, por favor permtanos de 1 a 2 das hbiles para completar [address]  Los precios de los medicamentos varan con frecuencia dependiendo del Environmental consultant de dnde se surte la receta y alguna farmacias pueden ofrecer precios ms baratos.  El sitio web www.goodrx.com tiene cupones para medicamentos de Health and safety inspector. Los precios aqu no tienen en cuenta lo que podra costar con la ayuda del seguro (puede ser ms barato con su seguro), pero el sitio web puede darle el precio si no utiliz Tourist information centre manager.  - Puede imprimir el cupn correspondiente y llevarlo con su receta a la farmacia.  - Tambin puede pasar por nuestra oficina durante el horario de atencin regular y Education officer, museum una tarjeta de cupones de GoodRx.  - Si necesita que su receta se enve electrnicamente a una farmacia diferente, informe a nuestra oficina a travs de MyChart de [place] o por telfono llamando al [phone] y presione la opcin 4.

## 2022-10-18 NOTE — Progress Notes (Signed)
Follow-Up Visit   Subjective  Johnny [name] is a 82 y.o. male who presents for the following: 3 month ak follow up  And follow up on eczema / dermatitis at right neck. Used tacrolimus ointment to affected areas but states that medication was very expensive    Eucrisa cream appply qd/bid until clear then prn flares   If still expensive mometason   The patient has spots, moles and lesions to be evaluated, some may be new or changing and the patient may have concern these could be cancer.   The following portions of the chart were reviewed this encounter and updated as appropriate: medications, allergies, medical history  Review of Systems:  No other skin or systemic complaints except as noted in HPI or Assessment and Plan.       Objective  Well appearing patient in no apparent distress; mood and affect are within normal limits.   A focused examination was performed of the following areas: Right neck, face, scalp, b/l hands, b/l arms  Relevant exam findings are noted in the Assessment and Plan.  scalp x 3, right temple x 2 , left ear x 1, nose x 2 (8) Erythematous thin papules/macules with gritty scale.   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 Condition is improving with treatment but not currently at goal.  Treatment Plan: Stop Tacrolimus 0.1% oint qd/bid until clear then prn flares  Start eucrisa ointment apply qd/bid until clear then prn flares Sample of otezla given today, discussed can use until patient is able to get new rx.  If eucrisa is not covered will consider mometasone   Irritant contact dermatitis, unspecified trigger  Related Medications Crisaborole (EUCRISA) 2 % OINT Apply topically to right neck qd/bid until  clear and then prn for flares  Actinic keratosis (8) scalp x 3, right temple x 2 , left ear x 1, nose x 2  Actinic keratoses are precancerous spots that appear secondary to cumulative UV radiation exposure/sun exposure over time. They are chronic with expected duration over 1 year. A portion of actinic keratoses will progress to squamous cell carcinoma of the skin. It is not possible to reliably predict which spots will progress to skin cancer and so treatment is recommended to prevent development of skin cancer.  Recommend daily broad spectrum sunscreen SPF 30+ to sun-exposed areas, reapply every 2 hours as needed.  Recommend staying in the shade or wearing long sleeves, sun glasses (UVA+UVB protection) and wide brim hats (4-inch brim around the entire circumference of the hat). Call for new or changing lesions.  Destruction of lesion - scalp x 3, right temple x 2 , left ear x 1, nose x 2 (8) Complexity: simple   Destruction method: cryotherapy   Informed consent: discussed and consent obtained   Timeout:  patient name, date of birth, surgical site, and procedure verified Lesion destroyed using liquid nitrogen: Yes   Region frozen until ice ball extended beyond lesion: Yes   Outcome: patient tolerated procedure well with no complications   Post-procedure details: wound care instructions given    Inflamed seborrheic keratosis right forearm near wrist x 1  Symptomatic, irritating, patient would like treated.  Destruction of lesion - right forearm near wrist x 1  Complexity: simple   Destruction method: cryotherapy   Informed consent: discussed and consent obtained   Timeout:  patient name, date of birth, surgical site, and procedure verified Lesion destroyed using liquid nitrogen: Yes   Region frozen until ice ball extended beyond lesion: Yes   Outcome: patient tolerated procedure well with no complications   Post-procedure details: wound care instructions given    ACTINIC DAMAGE -  chronic, secondary to cumulative UV radiation exposure/sun exposure over time - diffuse scaly erythematous macules with underlying dyspigmentation - Recommend daily broad spectrum sunscreen SPF 30+ to sun-exposed areas, reapply every 2 hours as needed.  - Recommend staying in the shade or wearing long sleeves, sun glasses (UVA+UVB protection) and wide brim hats (4-inch brim around the entire circumference of the hat). - Call for new or changing lesions.   Return for 6 month ak/ isk  follow up.  IAsher Muir, CMA, am acting as scribe for Armida Sans, MD.   Documentation: I have reviewed the above documentation for accuracy and completeness, and I agree with the above.  Armida Sans, MD

## 2022-10-20 ENCOUNTER — Encounter: Payer: Self-pay | Admitting: Dermatology

## 2022-10-26 ENCOUNTER — Telehealth: Payer: Self-pay

## 2022-10-26 MED ORDER — MOMETASONE FUROATE 0.1 % EX CREA: 1.0000 | TOPICAL_CREAM | CUTANEOUS | 1 refills | Status: DC

## 2022-10-26 NOTE — Telephone Encounter (Signed)
Johnny [name] will cost patient over $900. Okay to send in the mometasone discussed at office visit?

## 2022-10-26 NOTE — Telephone Encounter (Signed)
Patient advised of medication change and RX sent in. aw

## 2022-10-27 ENCOUNTER — Other Ambulatory Visit: Payer: Self-pay | Admitting: Specialist

## 2022-10-27 DIAGNOSIS — J849 Interstitial pulmonary disease, unspecified: Secondary | ICD-10-CM

## 2022-11-02 ENCOUNTER — Ambulatory Visit
Admission: RE | Admit: 2022-11-02 | Discharge: 2022-11-02 | Disposition: A | Discharge status: Home / Self Care | Payer: Medicare Other | Source: Ambulatory Visit | Attending: Specialist | Admitting: Specialist

## 2022-11-02 DIAGNOSIS — J849 Interstitial pulmonary disease, unspecified: Secondary | ICD-10-CM | POA: Diagnosis present

## 2022-12-28 ENCOUNTER — Ambulatory Visit: Payer: Medicare Other | Attending: Cardiovascular Disease | Admitting: Cardiovascular Disease

## 2022-12-28 ENCOUNTER — Encounter: Payer: Self-pay | Admitting: Cardiovascular Disease

## 2022-12-28 VITALS — BP 90/48 | HR 89 | Ht 70.5 in | Wt 165.0 lb

## 2022-12-28 DIAGNOSIS — I4719 Other supraventricular tachycardia: Secondary | ICD-10-CM

## 2022-12-28 DIAGNOSIS — R0602 Shortness of breath: Secondary | ICD-10-CM

## 2022-12-28 DIAGNOSIS — Z951 Presence of aortocoronary bypass graft: Secondary | ICD-10-CM

## 2022-12-28 DIAGNOSIS — E118 Type 2 diabetes mellitus with unspecified complications: Secondary | ICD-10-CM

## 2022-12-28 DIAGNOSIS — I10 Essential (primary) hypertension: Secondary | ICD-10-CM

## 2022-12-28 DIAGNOSIS — I25708 Atherosclerosis of coronary artery bypass graft(s), unspecified, with other forms of angina pectoris: Secondary | ICD-10-CM | POA: Diagnosis not present

## 2022-12-28 DIAGNOSIS — I255 Ischemic cardiomyopathy: Secondary | ICD-10-CM | POA: Diagnosis not present

## 2022-12-28 DIAGNOSIS — E782 Mixed hyperlipidemia: Secondary | ICD-10-CM

## 2022-12-28 DIAGNOSIS — I951 Orthostatic hypotension: Secondary | ICD-10-CM

## 2022-12-28 MED ORDER — EZETIMIBE 10 MG PO TABS: 10.0000 mg | ORAL_TABLET | Freq: Every day | ORAL | 3 refills | Status: DC

## 2022-12-28 MED ORDER — METOPROLOL TARTRATE 25 MG PO TABS: 25.0000 mg | ORAL_TABLET | Freq: Two times a day (BID) | ORAL | 3 refills | Status: AC

## 2022-12-28 NOTE — Patient Instructions (Signed)

## 2022-12-28 NOTE — Progress Notes (Signed)
Cardiology Office Note  Date:  12/28/2022   ID:  Johnny [name], DOB 1940-09-13, MRN [id number]  PCP:  Johnny Nurse, MD   Chief Complaint  Patient presents with   12 month follow up     Patient c/o weakness, right hand shakes most days & has nausea most days in the am. Medications reviewed by the patient verbally.     HPI:  [name] is a 82 y.o. male with  MI 67s CAD, CABG 1998, cardiac arrest following the procedure Ischemic cardiomyopathy ejection fraction 35 to 40% on echocardiogram 2019 Cath 11/2015 patent grafts x3 hyperlipidemia  SVT, adenosine 08/2017 Had SVT x 3 total (dec 2018, 08/2017 ( needed adenosine), 11/2017) Diabetes Chronic shortness of breath with overexertion Presenting for follow-up of his coronary disease, shortness of breath, SVT, prior ablation  Last seen in the clinic by myself April 2023  Reports taking his oxygen off at home to go in the shower, walk around the house Gets very short of breath without the oxygen, palpitations Has to sit back down, puts the oxygen back on, takes several minutes to recover Uses a scooter to get around the house, does not walk very much  Denies chest pain concerning for angina No significant lower extremity edema Chronic nausea in the morning, spitting up mucus Weight stable 166 pounds  Low blood pressure, denies orthostasis symptoms  no near-syncope  Lab work reviewed A1C 9.0 CR 1.2 Total cholo 141, LDL 56  EKG personally reviewed by myself on todays visit EKG Interpretation Date/Time:  Thursday December 28 2022 14:29:07 EST Ventricular Rate:  89 PR Interval:  146 QRS Duration:  90 QT Interval:  374 QTC Calculation: 455 R Axis:   -54  Text Interpretation: Sinus rhythm with occasional Premature ventricular complexes Left axis deviation Inferior infarct , age undetermined Anterolateral infarct , age undetermined When compared with ECG of 03-Sep-2017 02:35, anterolateral changes now noted Confirmed  by Johnny [name] [phone]) on 12/28/2022 2:44:59 PM   Other past medical history reviewed In the emergency room Jan 2020 for SVT and Dec 2019 Denies any recurrence of his SVT since ablation  Last echo 2019 Ejection fraction 35 to 40%  Other past medical history reviewed emergency room 01/25/2018 with palpitations.    associated with chest pain with radiation to the jaw.   His heart rate was in the 160s.   vagal maneuvers without success.  given 6 mg of adenosine which terminated his SVT.    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  no further episodes of SVT since ablation   telemetry strips from July 2019 documenting narrow complex tachycardia rate 169 bpm that broke with adenosine   hospitalization  new onset of palpitations chest pain and neck fluttering 09/03/2017  suddenly when he was at home.  seen by the EMS.   Possible SVT,  given adenosine  resolved within 3 to 5 minutes.     echocardiogram July 2019  LV dysfunction of 35 to 40% cardiac catheterization which showed EF of 40% in 2017  Remote weight loss from pancreas Chronic dizziness, since that time Used to be on losartan, medication was weaned off but continues to have orthostasis that is debilitating Feels it is affecting his life and making his balance worse  Prior cardiac catheterization results from 2017 Ramus lesion, 95 %stenosed. Mid RCA lesion, 100 %stenosed. SVG graft was visualized by angiography and is small. Origin to Prox Graft lesion, 50 %stenosed. SVG graft was visualized  by angiography and is normal in caliber and anatomically normal. Post Atrio lesion, 100 %stenosed. Ost Cx to Mid Cx lesion, 40 %stenosed. Dist RCA lesion, 100 %stenosed. Mid LAD lesion, 85 %stenosed. LM lesion, 30 %stenosed. Ost 1st Mrg to 1st Mrg lesion, 65 %stenosed. LIMA graft was visualized by angiography and is normal in caliber and anatomically normal.    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  PSH:    Past Surgical History:  Procedure Laterality Date   CARDIAC CATHETERIZATION Left 12/07/2015   Procedure: Left Heart Cath and Cors/Grafts Angiography;  Surgeon: Johnny Blinks, MD;  Location: [facility];  Service: Cardiovascular;  Laterality: Left;   CATARACT EXTRACTION W/PHACO Left 08/31/2020   Procedure: CATARACT EXTRACTION PHACO AND INTRAOCULAR LENS PLACEMENT (IOC) LEFT 5.53 00:38.7;  Surgeon: Galen Manila, MD;  Location: [facility];  Service: Ophthalmology;  Laterality: Left;  DEXTENZA Diabetic - oral meds   CATARACT EXTRACTION W/PHACO Right 09/14/2020   Procedure: CATARACT EXTRACTION PHACO AND INTRAOCULAR LENS PLACEMENT (IOC) RIGHT 6.87 00:53.5;  Surgeon: Galen Manila, MD;  Location: [facility];  Service: Ophthalmology;  Laterality: Right;  DEXTENZA Diabetic - oral meds   CORONARY ARTERY BYPASS GRAFT  1998   [facility]   ESOPHAGOGASTRODUODENOSCOPY (EGD) WITH PROPOFOL N/A 04/27/2021   Procedure: ESOPHAGOGASTRODUODENOSCOPY (EGD) WITH PROPOFOL;  Surgeon: Toledo, Boykin Nearing, MD;  Location: [facility]OSCOPY;  Service: Gastroenterology;  Laterality: N/A;  DM   SVT ABLATION N/A 03/13/2018   Procedure: SVT ABLATION;  Surgeon: Johnny Lemming, MD;  Location: [facility];  Service: Cardiovascular;  Laterality: N/A;    Current Outpatient Medications  Medication Sig Dispense Refill   allopurinol (ZYLOPRIM) 100 MG tablet Take 100 mg by mouth daily.     aspirin EC 81 MG tablet Take 1 tablet (81 mg total) by mouth daily. 90 tablet 0   Crisaborole (EUCRISA) 2 % OINT Apply topically to right neck qd/bid until clear  and then prn for flares 60 g 2   ezetimibe (ZETIA) 10 MG tablet Take 1 tablet (10 mg total) by mouth daily. 90 tablet 3   glipiZIDE (GLUCOTROL) 10 MG tablet Take 10 mg by mouth 2 (two) times daily before a meal.     metFORMIN (GLUCOPHAGE) 1000 MG tablet Take 1,000 mg by mouth 2 (two) times daily with a meal.      metoprolol tartrate (LOPRESSOR) 25 MG tablet Take 1 tablet (25 mg total) by mouth 2 (two) times daily. 180 tablet 3   mometasone (ELOCON) 0.1 % cream Apply 1 Application topically as directed. every day -bid up to 5 days per week as needed until improved 45 g 1   omeprazole (PRILOSEC) 20 MG capsule Take 20 mg by mouth daily.     OXYGEN Inhale 2 L into the lungs daily.     pioglitazone (ACTOS) 15 MG tablet Take 15 mg by mouth daily.     Pirfenidone 267 MG TABS Take 3 tablets by mouth 3 (three) times daily.     rOPINIRole (REQUIP) 0.25 MG tablet Take 1 tablet (0.25 mg total) by mouth at bedtime. 30 tablet 1   sertraline (ZOLOFT) 100 MG tablet Take 100 mg by mouth daily.     simvastatin (ZOCOR)  40 MG tablet Take 40 mg by mouth every evening.     sitaGLIPtin (JANUVIA) 50 MG tablet Take 1 tablet by mouth daily.     tacrolimus (PROTOPIC) 0.1 % ointment Apply topically as directed. Qd to bid to aa right neck until clear then prn flares 100 g 2   Wound Dressings (STRATA GRT) GEL Apply to wound BID until healed. 20 g 1   No current facility-administered medications for this visit.    Allergies:   Lisinopril, Salsalate, and Ace inhibitors   Social History:  The patient  reports that he quit smoking about 39 years ago. His smoking use included cigarettes. He started smoking about 64 years ago. He has a 37.5 pack-year smoking history. He has never used smokeless tobacco. He reports that he does not drink alcohol and does not use drugs.   Family History:   family history includes Heart attack in his brother, brother, father, sister, sister, and sister.    Review of Systems: Review of Systems   Constitutional: Negative.   HENT: Negative.    Respiratory:  Positive for shortness of breath.   Cardiovascular: Negative.   Gastrointestinal: Negative.   Musculoskeletal: Negative.   Neurological: Negative.   Psychiatric/Behavioral: Negative.    All other systems reviewed and are negative.  PHYSICAL EXAM: VS:  BP (!) 90/48 (BP Location: Left Arm, Patient Position: Sitting, Cuff Size: Normal)   Pulse 89   Ht 5' 10.5" (1.791 m)   Wt 165 lb (74.8 kg)   SpO2 97% Comment: on 2 liters of oxygen  BMI 23.34 kg/m  , BMI Body mass index is 23.34 kg/m. Constitutional:  oriented to person, place, and time. No distress.  On oxygen HENT:  Head: Grossly normal Eyes:  no discharge. No scleral icterus.  Neck: No JVD, no carotid bruits  Cardiovascular: Regular rate and rhythm, no murmurs appreciated Pulmonary/Chest: Moderately decreased breath sounds throughout, scattered rales Abdominal: Soft.  no distension.  no tenderness.  Musculoskeletal: Normal range of motion Neurological:  normal muscle tone. Coordination normal. No atrophy Skin: Skin warm and dry Psychiatric: normal affect, pleasant  Recent Labs: No results found for requested labs within last 365 days.    Lipid Panel Lab Results  Component Value Date   CHOL 158 04/25/2012   HDL 77 (H) 04/25/2012   LDLCALC 62 04/25/2012   TRIG 97 04/25/2012      Wt Readings from Last 3 Encounters:  12/28/22 165 lb (74.8 kg)  05/27/21 166 lb (75.3 kg)  04/27/21 165 lb (74.8 kg)     ASSESSMENT AND PLAN:  Coronary artery disease of native artery of native heart with stable angina pectoris (HCC) -  Currently with no symptoms of angina. No further workup at this time. Continue current medication regimen.  SVT (supraventricular tachycardia) (HCC) - Plan: EKG 12-Lead Prior ablation, successful,  No arrhythmia  Shortness of breath Deconditioned, no regular exercise program Underlying COPD, on oxygen Takes it off to move in the house,  we have recommended he keep the oxygen on at all times even in the shower  Orthostasis Not on meds Encouraged fluids Not on midodrine or Florinef Denies near syncope, Weight stable  Mixed hyperlipidemia Continue statin zetia Cholesterol is at goal on the current lipid regimen. No changes to the medications were made.  Ischemic cardiomyopathy Ejection fraction 35 to 40%, In 2019 Reports symptoms are stable, appears euvolemic  Orders Placed This Encounter  Procedures   EKG 12-Lead     Signed, Dossie Arbour,  M.D., Ph.D. 12/28/2022  [facility] [place], [place] [phone]

## 2023-02-28 ENCOUNTER — Other Ambulatory Visit: Payer: Self-pay | Admitting: Specialist

## 2023-02-28 DIAGNOSIS — R768 Other specified abnormal immunological findings in serum: Secondary | ICD-10-CM

## 2023-02-28 DIAGNOSIS — J849 Interstitial pulmonary disease, unspecified: Secondary | ICD-10-CM

## 2023-03-13 ENCOUNTER — Ambulatory Visit
Admission: RE | Admit: 2023-03-13 | Discharge: 2023-03-13 | Disposition: A | Discharge status: Home / Self Care | Payer: Medicare Other | Source: Ambulatory Visit | Attending: Specialist | Admitting: Specialist

## 2023-03-13 DIAGNOSIS — R768 Other specified abnormal immunological findings in serum: Secondary | ICD-10-CM | POA: Diagnosis present

## 2023-03-13 DIAGNOSIS — J849 Interstitial pulmonary disease, unspecified: Secondary | ICD-10-CM | POA: Insufficient documentation

## 2023-05-02 ENCOUNTER — Encounter: Payer: Self-pay | Admitting: Dermatology

## 2023-05-02 ENCOUNTER — Ambulatory Visit: Payer: Medicare Other | Admitting: Dermatology

## 2023-05-02 DIAGNOSIS — D099 Carcinoma in situ, unspecified: Secondary | ICD-10-CM

## 2023-05-02 DIAGNOSIS — D044 Carcinoma in situ of skin of scalp and neck: Secondary | ICD-10-CM

## 2023-05-02 DIAGNOSIS — R21 Rash and other nonspecific skin eruption: Secondary | ICD-10-CM | POA: Diagnosis not present

## 2023-05-02 DIAGNOSIS — L578 Other skin changes due to chronic exposure to nonionizing radiation: Secondary | ICD-10-CM

## 2023-05-02 DIAGNOSIS — L570 Actinic keratosis: Secondary | ICD-10-CM | POA: Diagnosis not present

## 2023-05-02 DIAGNOSIS — L03221 Cellulitis of neck: Secondary | ICD-10-CM

## 2023-05-02 DIAGNOSIS — W908XXA Exposure to other nonionizing radiation, initial encounter: Secondary | ICD-10-CM

## 2023-05-02 DIAGNOSIS — Z7189 Other specified counseling: Secondary | ICD-10-CM

## 2023-05-02 DIAGNOSIS — L821 Other seborrheic keratosis: Secondary | ICD-10-CM

## 2023-05-02 DIAGNOSIS — L814 Other melanin hyperpigmentation: Secondary | ICD-10-CM

## 2023-05-02 DIAGNOSIS — Z79899 Other long term (current) drug therapy: Secondary | ICD-10-CM

## 2023-05-02 DIAGNOSIS — L853 Xerosis cutis: Secondary | ICD-10-CM

## 2023-05-02 HISTORY — DX: Carcinoma in situ, unspecified: D09.9

## 2023-05-02 MED ORDER — CEPHALEXIN 500 MG PO CAPS: ORAL_CAPSULE | ORAL | 0 refills | Status: AC

## 2023-05-02 MED ORDER — MUPIROCIN 2 % EX OINT: TOPICAL_OINTMENT | CUTANEOUS | 0 refills | Status: AC

## 2023-05-02 NOTE — Patient Instructions (Addendum)

## 2023-05-02 NOTE — Progress Notes (Signed)
 Follow-Up Visit   Subjective  Johnny [name] is a 83 y.o. male who presents for the following: Rash on the R neck flared today - pt has tried and failed TMC 0.1%, tacrolimus 0.1% ointment, Eucrisa, Mometasone 0.1%, and Opzelura. Rash is painful and sometimes bleeds has been present almost a year and will not resolve.   The patient has spots, moles and lesions to be evaluated, some may be new or changing and the patient may have concern these could be cancer.   The following portions of the chart were reviewed this encounter and updated as appropriate: medications, allergies, medical history  Review of Systems:  No other skin or systemic complaints except as noted in HPI or Assessment and Plan.  Objective  Well appearing patient in no apparent distress; mood and affect are within normal limits.  A focused examination was performed of the following areas: the face, neck, arms, and hands  Relevant exam findings are noted in the Assessment and Plan.  L ear above the earlobe x 1 Erythematous thin papules/macules with gritty scale.  R neck Pink plaque with yellow crusts and some erosion.   Assessment & Plan   ACTINIC DAMAGE - chronic, secondary to cumulative UV radiation exposure/sun exposure over time - diffuse scaly erythematous macules with underlying dyspigmentation - Recommend daily broad spectrum sunscreen SPF 30+ to sun-exposed areas, reapply every 2 hours as needed.  - Recommend staying in the shade or wearing long sleeves, sun glasses (UVA+UVB protection) and wide brim hats (4-inch brim around the entire circumference of the hat). - Call for new or changing lesions.  SEBORRHEIC KERATOSIS - Stuck-on, waxy, tan-brown papules and/or plaques  - Benign-appearing - Discussed benign etiology and prognosis. - Observe - Call for any changes  LENTIGINES Exam: scattered tan macules Due to sun exposure Treatment Plan: Benign-appearing, observe. Recommend daily broad spectrum  sunscreen SPF 30+ to sun-exposed areas, reapply every 2 hours as needed.  Call for any changes   Xerosis and peeling of the scalp - recommend Eucerin lotion with urea daily to aa's.  AK (ACTINIC KERATOSIS) L ear above the earlobe x 1 Actinic keratoses are precancerous spots that appear secondary to cumulative UV radiation exposure/sun exposure over time. They are chronic with expected duration over 1 year. A portion of actinic keratoses will progress to squamous cell carcinoma of the skin. It is not possible to reliably predict which spots will progress to skin cancer and so treatment is recommended to prevent development of skin cancer.  Recommend daily broad spectrum sunscreen SPF 30+ to sun-exposed areas, reapply every 2 hours as needed.  Recommend staying in the shade or wearing long sleeves, sun glasses (UVA+UVB protection) and wide brim hats (4-inch brim around the entire circumference of the hat). Call for new or changing lesions.  Destruction of lesion - L ear above the earlobe x 1 Complexity: simple   Destruction method: cryotherapy   Informed consent: discussed and consent obtained   Timeout:  patient name, date of birth, surgical site, and procedure verified Lesion destroyed using liquid nitrogen: Yes   Region frozen until ice ball extended beyond lesion: Yes   Outcome: patient tolerated procedure well with no complications   Post-procedure details: wound care instructions given   RASH AND OTHER NONSPECIFIC SKIN ERUPTION R neck Start Cephalexcin 500 mg po BID x 10 days. Start Mupirocin 2% ointment to aa's BID Anaerobic and Aerobic Culture - R neck  Skin / nail biopsy - R neck Type of biopsy:  tangential   Informed consent: discussed and consent obtained   Timeout: patient name, date of birth, surgical site, and procedure verified   Procedure prep:  Patient was prepped and draped in usual sterile fashion Prep type:  Isopropyl alcohol Anesthesia: the lesion was anesthetized  in a standard fashion   Anesthetic:  1% lidocaine w/ epinephrine 1-100,000 buffered w/ 8.4% NaHCO3 Instrument used: flexible razor blade   Hemostasis achieved with: pressure, aluminum chloride and electrodesiccation   Outcome: patient tolerated procedure well   Post-procedure details: sterile dressing applied and wound care instructions given   Dressing type: bandage and petrolatum   Specimen 1 - Surgical pathology Differential Diagnosis: D48.5 r/o skin cancer vs cellulitis vs other Check Margins: No  Return in about 6 months (around 11/02/2023).  Maylene Roes, CMA, am acting as scribe for Armida Sans, MD .   Documentation: I have reviewed the above documentation for accuracy and completeness, and I agree with the above.  Armida Sans, MD

## 2023-05-07 LAB — SURGICAL PATHOLOGY

## 2023-05-08 ENCOUNTER — Telehealth: Payer: Self-pay

## 2023-05-08 NOTE — Telephone Encounter (Addendum)
 Called and discussed results with patient's daughter. Scheduled appointment to discuss treatment options for patient's sccis at right neck. Daughter mentioned 2 small spots at right neck that came up after appointment and 1 spot at right eye area she would like checked at patient's next appointment.   Patient and daughter will decide treatment options at next appointment May 7 at 1:30 to discuss treatment for sccis at right neck   ----- Message from Armida Sans sent at 05/07/2023  7:12 PM EDT ----- FINAL DIAGNOSIS        1. 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

## 2023-05-09 ENCOUNTER — Telehealth: Payer: Self-pay

## 2023-05-09 LAB — ANAEROBIC AND AEROBIC CULTURE

## 2023-05-09 NOTE — Telephone Encounter (Addendum)
   Called and spoke with daughter Dewayne Hatch about patient starting 5 f/u / calcipotriene cream and instructions. Went over direction and where the medication would be delivered from. Daughter given number if she needed to call [facility].  Instructed to keep follow up as scheduled.     ----- Message from Armida Sans sent at 05/09/2023  9:30 AM EDT ----- 05/02/2023 culture from neck wound showed: Normal skin flora - no infection  Biopsy from area revealed SCCis.  Pt scheduled for follow up 07/15/2023.Marland Kitchen   He may go ahead now and start topical Chemo to the Right Neck SCCis with 5-FU / Calcipotriene - apply bid - thin coat for 7 days. Advise may get red and irritated. Keep follow up appt

## 2023-05-24 ENCOUNTER — Telehealth: Payer: Self-pay

## 2023-05-24 NOTE — Telephone Encounter (Signed)
 Patient daughter called to get instructions on using 5FU/Calcipotriene cream, discussed directions with pt daughter- apply a thin film bid x 7 days then stop

## 2023-06-25 ENCOUNTER — Ambulatory Visit: Admitting: Dermatology

## 2023-06-25 ENCOUNTER — Encounter: Payer: Self-pay | Admitting: Dermatology

## 2023-06-25 DIAGNOSIS — L570 Actinic keratosis: Secondary | ICD-10-CM | POA: Diagnosis not present

## 2023-06-25 DIAGNOSIS — W908XXA Exposure to other nonionizing radiation, initial encounter: Secondary | ICD-10-CM | POA: Diagnosis not present

## 2023-06-25 DIAGNOSIS — Z5111 Encounter for antineoplastic chemotherapy: Secondary | ICD-10-CM | POA: Diagnosis not present

## 2023-06-25 DIAGNOSIS — D099 Carcinoma in situ, unspecified: Secondary | ICD-10-CM

## 2023-06-25 DIAGNOSIS — D044 Carcinoma in situ of skin of scalp and neck: Secondary | ICD-10-CM

## 2023-06-25 DIAGNOSIS — L98491 Non-pressure chronic ulcer of skin of other sites limited to breakdown of skin: Secondary | ICD-10-CM

## 2023-06-25 DIAGNOSIS — L578 Other skin changes due to chronic exposure to nonionizing radiation: Secondary | ICD-10-CM | POA: Diagnosis not present

## 2023-06-25 DIAGNOSIS — Z7189 Other specified counseling: Secondary | ICD-10-CM

## 2023-06-25 DIAGNOSIS — Z79899 Other long term (current) drug therapy: Secondary | ICD-10-CM

## 2023-06-25 NOTE — Patient Instructions (Addendum)
 Give this 1 month to heal  Restart cream in June 7 twice daily for another 7 days   - ReStart 5-fluorouracil/calcipotriene cream twice a day for 7 days to affected areas including left neck. Prescription sent to [facility]. Patient advised they will receive an email to purchase the medication online and have it sent to their home. Patient provided with handout reviewing treatment course and side effects and advised to call or message us  on MyChart with any concerns.  Reviewed course of treatment and expected reaction.  Patient advised to expect inflammation and crusting and advised that erosions are possible.  Patient advised to be diligent with sun protection during and after treatment. Counseled to keep medication out of reach of children and pets.  Can use warm compresses to help with scabbing at area  Can take motrin to help with pain  Can take motrin or tylenol  before next visit   5-Fluorouracil/Calcipotriene Patient Education   Actinic keratoses are the dry, red scaly spots on the skin caused by sun damage. A portion of these spots can turn into skin cancer with time, and treating them can help prevent development of skin cancer.   Treatment of these spots requires removal of the defective skin cells. There are various ways to remove actinic keratoses, including freezing with liquid nitrogen, treatment with creams, or treatment with a blue light procedure in the office.   5-fluorouracil cream is a topical cream used to treat actinic keratoses. It works by interfering with the growth of abnormal fast-growing skin cells, such as actinic keratoses. These cells peel off and are replaced by healthy ones.   5-fluorouracil/calcipotriene is a combination of the 5-fluorouracil cream with a vitamin D analog cream called calcipotriene. The calcipotriene alone does not treat actinic keratoses. However, when it is combined with 5-fluorouracil, it helps the 5-fluorouracil treat  the actinic keratoses much faster so that the same results can be achieved with a much shorter treatment time.  INSTRUCTIONS FOR 5-FLUOROURACIL/CALCIPOTRIENE CREAM:   5-fluorouracil/calcipotriene cream typically only needs to be used for 4-7 days. A thin layer should be applied twice a day to the treatment areas recommended by your physician.   If your physician prescribed you separate tubes of 5-fluourouracil and calcipotriene, apply a thin layer of 5-fluorouracil followed by a thin layer of calcipotriene.   Avoid contact with your eyes, nostrils, and mouth. Do not use 5-fluorouracil/calcipotriene cream on infected or open wounds.   You will develop redness, irritation and some crusting at areas where you have pre-cancer damage/actinic keratoses. IF YOU DEVELOP PAIN, BLEEDING, OR SIGNIFICANT CRUSTING, STOP THE TREATMENT EARLY - you have already gotten a good response and the actinic keratoses should clear up well.  Wash your hands after applying 5-fluorouracil 5% cream on your skin.   A moisturizer or sunscreen with a minimum SPF 30 should be applied each morning.   Once you have finished the treatment, you can apply a thin layer of Vaseline twice a day to irritated areas to soothe and calm the areas more quickly. If you experience significant discomfort, contact your physician.  For some patients it is necessary to repeat the treatment for best results.  SIDE EFFECTS: When using 5-fluorouracil/calcipotriene cream, you may have mild irritation, such as redness, dryness, swelling, or a mild burning sensation. This usually resolves within 2 weeks. The more actinic keratoses you have, the more redness and inflammation you can expect during treatment. Eye irritation has been reported rarely. If this occurs, please let  us  know.  If you have any trouble using this cream, please call the office. If you have any other questions about this information, please do not hesitate to ask me before you leave  the office.   Due to recent changes in healthcare laws, you may see results of your pathology and/or laboratory studies on MyChart before the doctors have had a chance to review them. We understand that in some cases there may be results that are confusing or concerning to you. Please understand that not all results are received at the same time and often the doctors may need to interpret multiple results in order to provide you with the best plan of care or course of treatment. Therefore, we ask that you please give us  2 business days to thoroughly review all your results before contacting the office for clarification. Should we see a critical lab result, you will be contacted sooner.   If You Need Anything After Your Visit  If you have any questions or concerns for your doctor, please call our main line at [phone] and press option 4 to reach your doctor's medical assistant. If no one answers, please leave a voicemail as directed and we will return your call as soon as possible. Messages left after 4 pm will be answered the following business day.   You may also send us  a message via MyChart. We typically respond to MyChart messages within 1-2 business days.  For prescription refills, please ask your pharmacy to contact our office. Our fax number is [phone].  If you have an urgent issue when the clinic is closed that cannot wait until the next business day, you can page your doctor at the number below.    Please note that while we do our best to be available for urgent issues outside of office hours, we are not available 24/7.   If you have an urgent issue and are unable to reach us , you may choose to seek medical care at your doctor's office, retail clinic, urgent care center, or emergency room.  If you have a medical emergency, please immediately call 911 or go to the emergency department.  Pager Numbers  - Dr. Bary Likes: [phone]  - Dr. Annette Barters: [phone]  - Dr. Felipe Horton:  [phone]   In the event of inclement weather, please call our main line at [phone] for an update on the status of any delays or closures.  Dermatology Medication Tips: Please keep the boxes that topical medications come in in order to help keep track of the instructions about where and how to use these. Pharmacies typically print the medication instructions only on the boxes and not directly on the medication tubes.   If your medication is too expensive, please contact our office at [phone] option 4 or send us  a message through MyChart.   We are unable to tell what your co-pay for medications will be in advance as this is different depending on your insurance coverage. However, we may be able to find a substitute medication at lower cost or fill out paperwork to get insurance to cover a needed medication.   If a prior authorization is required to get your medication covered by your insurance company, please allow us  1-2 business days to complete this process.  Drug prices often vary depending on where the prescription is filled and some pharmacies may offer cheaper prices.  The website www.goodrx.com contains coupons for medications through different pharmacies. The prices here do not account for what the cost  may be with help from insurance (it may be cheaper with your insurance), but the website can give you the price if you did not use any insurance.  - You can print the associated coupon and take it with your prescription to the pharmacy.  - You may also stop by our office during regular business hours and pick up a GoodRx coupon card.  - If you need your prescription sent electronically to a different pharmacy, notify our office through [facility] or by phone at [phone] option 4.     Si Usted Necesita Algo Despus de Su Visita  Tambin puede enviarnos un mensaje a travs de Clinical cytogeneticist. Por lo general respondemos a los mensajes de MyChart en el transcurso de 1 a  2 das hbiles.  Para renovar recetas, por favor pida a su farmacia que se ponga en contacto con nuestra oficina. Franz Jacks de fax es [place] [phone].  Si tiene un asunto urgente cuando la clnica est cerrada y que no puede esperar hasta el siguiente da hbil, puede llamar/localizar a su doctor(a) al nmero que aparece a continuacin.   Por favor, tenga en cuenta que aunque hacemos todo lo posible para estar disponibles para asuntos urgentes fuera del horario de [place], no estamos disponibles las 24 horas del da, los 7 [address]  [address] de la [place].   Si tiene un problema urgente y no puede comunicarse con nosotros, puede optar por buscar atencin mdica  en el consultorio de su doctor(a), en una clnica privada, en un centro de atencin urgente o en una sala de emergencias.  Si tiene Engineer, drilling, por favor llame inmediatamente al 911 o vaya a la sala de emergencias.  Nmeros de bper  - Dr. Bary Likes: [phone]  - Dra. Annette Barters: [phone]  - Dr. Felipe Horton: [phone]   En caso de inclemencias del tiempo, por favor llame a Lajuan Pila principal al [phone] para una actualizacin sobre el [place] de cualquier retraso o cierre.  Consejos para la medicacin en dermatologa: Por favor, guarde las cajas en las que vienen los medicamentos de uso tpico para ayudarle a seguir las instrucciones sobre dnde y cmo usarlos. Las farmacias generalmente imprimen las instrucciones del medicamento slo en las cajas y no directamente en los tubos del [place].   Si su medicamento es muy caro, por favor, pngase en contacto con Bettyjane Brunet llamando al [phone] y presione la opcin 4 o envenos un mensaje a travs de Clinical cytogeneticist.   No podemos decirle cul ser su copago por los medicamentos por adelantado ya que esto es diferente dependiendo de la cobertura de su seguro. Sin embargo, es posible que podamos encontrar un medicamento sustituto a Audiological scientist un formulario para que  el seguro cubra el medicamento que se considera necesario.   Si se requiere una autorizacin previa para que su compaa de seguros [place] su medicamento, por favor permtanos de 1 a 2 das hbiles para completar este proceso.  Los precios de los medicamentos varan con frecuencia dependiendo del Environmental consultant de dnde se surte la receta y alguna farmacias pueden ofrecer precios ms baratos.  El sitio web www.goodrx.com tiene cupones para medicamentos de Health and safety inspector. Los precios aqu no tienen en cuenta lo que podra costar con la ayuda del seguro (puede ser ms barato con su seguro), pero el sitio web puede darle el precio si no utiliz Tourist information centre manager.  - Puede imprimir el cupn correspondiente y llevarlo con su receta a la farmacia.  - Tambin puede pasar por  nuestra oficina durante el horario de atencin regular y recoger una tarjeta de cupones de GoodRx.  - Si necesita que su receta se enve electrnicamente a una farmacia diferente, informe a nuestra oficina a travs de MyChart de [place] o por telfono llamando al [phone] y presione la opcin 4.

## 2023-06-25 NOTE — Progress Notes (Addendum)
 Follow-Up Visit   Subjective  Johnny [name] is a 83 y.o. 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 Patient has already used 33f/u cream bid for 7 days at right neck. Patient reports made area red and scabbed and used cream 3 weeks ago.   The patient has spots, moles and lesions to be evaluated, some may be new or changing and the patiecknt may have concern these could be cancer.  The following portions of the chart were reviewed this encounter and updated as appropriate: medications, allergies, medical history  Review of Systems:  No other skin or systemic complaints except as noted in HPI or Assessment and Plan.  Objective  Well appearing patient in no apparent distress; mood and affect are within normal limits.  A focused examination was performed of the following areas: Right neck  Relevant exam findings are noted in the Assessment and Plan.  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 Debridement: hyperkeratotic portion removed with sharp debridement   Lesion destroyed using liquid nitrogen: Yes   Region frozen until ice ball extended beyond lesion: Yes   Final wound size (cm):  6 Outcome: patient tolerated procedure well with no complications   Post-procedure details: wound care instructions given    Post-procedure details comment:  Ointment and small bandage applied Bx proven sccis  Refer to previous pathology [id number] 3/12/202 ACTINIC SKIN DAMAGE   CHEMOTHERAPY MANAGEMENT, ENCOUNTER FOR   COUNSELING AND COORDINATION OF CARE   MEDICATION MANAGEMENT    Patient had treated SCCIS with 1 round of 5 f/u calcipotriene fluorouracil cream twice daily for 7 days   Patient instructed to start another round of 5 f/u calcipotriene fluorouracil cream twice daily for 7 days to affected area in 1 month.  ACTINIC DAMAGE WITH PRECANCEROUS ACTINIC KERATOSES Counseling for Topical Chemotherapy Management: Patient exhibits: - Severe, confluent actinic changes with pre-cancerous actinic keratoses that is secondary to cumulative UV radiation exposure over time - Condition that is severe; chronic, not at goal. - diffuse scaly erythematous macules and papules with underlying dyspigmentation - Discussed Prescription "Field Treatment" topical Chemotherapy for Severe, Chronic Confluent Actinic Changes with Pre-Cancerous Actinic Keratoses Field treatment involves treatment of an entire area of skin that has confluent Actinic Changes (Sun/ Ultraviolet light damage) and PreCancerous Actinic Keratoses by method of PhotoDynamic Therapy (PDT) and/or prescription Topical Chemotherapy agents such as 5-fluorouracil, 5-fluorouracil/calcipotriene, and/or imiquimod.  The purpose is to decrease the number of clinically evident and subclinical PreCancerous lesions to prevent progression to development of skin cancer by chemically destroying early precancer changes that may or may not be visible.  It has been shown to reduce the risk of developing skin cancer in the treated area. As a result of treatment, redness, scaling, crusting, and open sores may occur during treatment course. One or more than one of these  methods may be used and may have to be used several times to control, suppress and eliminate the PreCancerous  changes. Discussed treatment course, expected reaction, and possible side effects. - Recommend daily broad spectrum sunscreen SPF 30+ to sun-exposed areas, reapply every 2 hours as needed.  - Staying in the shade or wearing long sleeves, sun glasses (UVA+UVB protection) and wide brim hats (4-inch brim around the entire circumference of the hat) are also recommended. - Call for new or changing lesions.  ACTINIC DAMAGE - chronic, secondary to cumulative UV radiation exposure/sun exposure over time - diffuse scaly erythematous macules with underlying dyspigmentation - Recommend daily broad spectrum sunscreen SPF 30+ to sun-exposed areas, reapply every 2 hours as needed.  - Recommend staying in the shade or wearing long sleeves, sun glasses (UVA+UVB protection) and wide brim hats (4-inch brim around the entire circumference of the hat). - Call for new or changing lesions.  Return for 3 - 4 month follow up.  IRandee Busing, CMA, am acting as scribe for Celine Collard, MD.   Documentation: I have reviewed the above documentation for accuracy and completeness, and I agree with the above.  Celine Collard, MD

## 2023-06-27 ENCOUNTER — Telehealth: Payer: Self-pay

## 2023-06-27 NOTE — Telephone Encounter (Signed)
 Johnny [name] has been advised of information per Dr. Bary Likes. aw

## 2023-06-27 NOTE — Telephone Encounter (Signed)
 Sammie Crigler DeFoor, PA from [facility] called regarding mutual patient and treating with a trail of CellCept with his pulmonologist. They would like to confirm this treatment will not interfere with current skin cancer treatment at this time?  Broadus Canes Phone # [phone]

## 2023-09-04 ENCOUNTER — Ambulatory Visit: Admitting: Dermatology

## 2023-09-04 ENCOUNTER — Encounter: Payer: Self-pay | Admitting: Dermatology

## 2023-09-04 DIAGNOSIS — Z79899 Other long term (current) drug therapy: Secondary | ICD-10-CM

## 2023-09-04 DIAGNOSIS — Z5111 Encounter for antineoplastic chemotherapy: Secondary | ICD-10-CM | POA: Diagnosis not present

## 2023-09-04 DIAGNOSIS — D044 Carcinoma in situ of skin of scalp and neck: Secondary | ICD-10-CM

## 2023-09-04 DIAGNOSIS — Z7189 Other specified counseling: Secondary | ICD-10-CM

## 2023-09-04 NOTE — Progress Notes (Unsigned)
   Follow-Up Visit   Subjective  Johnny [name] is a 83 y.o. male who presents for the following: SCC IS R neck, LN2 06/25/23, 5FU/Calcipotriene cr x 2 rounds,   Patient accompanied by daughter who contributes to history.  The following portions of the chart were reviewed this encounter and updated as appropriate: medications, allergies, medical history  Review of Systems:  No other skin or systemic complaints except as noted in HPI or Assessment and Plan.  Objective  Well appearing patient in no apparent distress; mood and affect are within normal limits.   A focused examination was performed of the following areas: neck  Relevant exam findings are noted in the Assessment and Plan.  R neck    Assessment & Plan   SQUAMOUS CELL CARCINOMA  Bx proven LN2 06/25/2023, 5FU/Calcipotriene x 2 rounds Exam: scale, crust and tenderness with scarring R neck, see photo  Treatment Plan: Restart 5FU/Calcipotriene cr bid x 14 days, if need to stop after a week b/c too painful, may stop for a week then restart bid for an additional week May consider bx if not improving, discussed if SCC still present consider Radiation vs Excision  5-fluorouracil/calcipotriene cream is is a type of field treatment used to treat precancers, thin skin cancers, and areas of sun damage. Expected reaction includes irritation and mild inflammation potentially progressing to more severe inflammation including redness, scaling, crusting and open sores/erosions.  If too much irritation occurs, ensure application of only a thin layer and decrease frequency of use to achieve a tolerable level of inflammation. Recommend applying Vaseline ointment to open sores as needed.  Minimize sun exposure while under treatment. Recommend daily broad spectrum sunscreen SPF 30+ to sun-exposed areas, reapply every 2 hours as needed.        Return for as scheduled.  I, Johnny [name], RMA, am acting as scribe for Johnny Rhyme, MD  .   Documentation: I have reviewed the above documentation for accuracy and completeness, and I agree with the above.  Johnny Rhyme, MD

## 2023-09-04 NOTE — Patient Instructions (Addendum)
 Restart 5FU/Calcipotriene cream twice a day for 14 days, if need to stop after a week because too painful, may stop for a week then restart twice a day for an additional week.  You want to do 2 weeks of treatment at twice a day.     Due to recent changes in healthcare laws, you may see results of your pathology and/or laboratory studies on MyChart before the doctors have had a chance to review them. We understand that in some cases there may be results that are confusing or concerning to you. Please understand that not all results are received at the same time and often the doctors may need to interpret multiple results in order to provide you with the best plan of care or course of treatment. Therefore, we ask that you please give us  2 business days to thoroughly review all your results before contacting the office for clarification. Should we see a critical lab result, you will be contacted sooner.   If You Need Anything After Your Visit  If you have any questions or concerns for your doctor, please call our main line at [phone] and press option 4 to reach your doctor's medical assistant. If no one answers, please leave a voicemail as directed and we will return your call as soon as possible. Messages left after 4 pm will be answered the following business day.   You may also send us  a message via MyChart. We typically respond to MyChart messages within 1-2 business days.  For prescription refills, please ask your pharmacy to contact our office. Our fax number is [phone].  If you have an urgent issue when the clinic is closed that cannot wait until the next business day, you can page your doctor at the number below.    Please note that while we do our best to be available for urgent issues outside of office hours, we are not available 24/7.   If you have an urgent issue and are unable to reach us , you may choose to seek medical care at your doctor's office, retail clinic, urgent care  center, or emergency room.  If you have a medical emergency, please immediately call 911 or go to the emergency department.  Pager Numbers  - Dr. Hester: [phone]  - Dr. Jackquline: [phone]  - Dr. Claudene: [phone]   In the event of inclement weather, please call our main line at [phone] for an update on the status of any delays or closures.  Dermatology Medication Tips: Please keep the boxes that topical medications come in in order to help keep track of the instructions about where and how to use these. Pharmacies typically print the medication instructions only on the boxes and not directly on the medication tubes.   If your medication is too expensive, please contact our office at [phone] option 4 or send us  a message through MyChart.   We are unable to tell what your co-pay for medications will be in advance as this is different depending on your insurance coverage. However, we may be able to find a substitute medication at lower cost or fill out paperwork to get insurance to cover a needed medication.   If a prior authorization is required to get your medication covered by your insurance company, please allow us  1-2 business days to complete this process.  Drug prices often vary depending on where the prescription is filled and some pharmacies may offer cheaper prices.  The website www.goodrx.com contains coupons for medications through different  pharmacies. The prices here do not account for what the cost may be with help from insurance (it may be cheaper with your insurance), but the website can give you the price if you did not use any insurance.  - You can print the associated coupon and take it with your prescription to the pharmacy.  - You may also stop by our office during regular business hours and pick up a GoodRx coupon card.  - If you need your prescription sent electronically to a different pharmacy, notify our office through [facility] or by  phone at [phone] option 4.     Si Usted Necesita Algo Despus de Su Visita  Tambin puede enviarnos un mensaje a travs de Clinical cytogeneticist. Por lo general respondemos a los mensajes de MyChart en el transcurso de 1 a 2 das hbiles.  Para renovar recetas, por favor pida a su farmacia que se ponga en contacto con nuestra oficina. Randi lakes de fax es [place] [phone].  Si tiene un asunto urgente cuando la clnica est cerrada y que no puede esperar hasta el siguiente da hbil, puede llamar/localizar a su doctor(a) al nmero que aparece a continuacin.   Por favor, tenga en cuenta que aunque hacemos todo lo posible para estar disponibles para asuntos urgentes fuera del horario de [place], no estamos disponibles las 24 horas del da, los 7 [address]  [address] de la [place].   Si tiene un problema urgente y no puede comunicarse con nosotros, puede optar por buscar atencin mdica  en el consultorio de su doctor(a), en una clnica privada, en un centro de atencin urgente o en una sala de emergencias.  Si tiene Engineer, drilling, por favor llame inmediatamente al 911 o vaya a la sala de emergencias.  Nmeros de bper  - Dr. Hester: [phone]  - Dra. Jackquline: [phone]  - Dr. Claudene: [phone]   En caso de inclemencias del tiempo, por favor llame a landry capes principal al [phone] para una actualizacin sobre el [place] de cualquier retraso o cierre.  Consejos para la medicacin en dermatologa: Por favor, guarde las cajas en las que vienen los medicamentos de uso tpico para ayudarle a seguir las instrucciones sobre dnde y cmo usarlos. Las farmacias generalmente imprimen las instrucciones del medicamento slo en las cajas y no directamente en los tubos del [place].   Si su medicamento es muy caro, por favor, pngase en contacto con landry rieger llamando al [phone] y presione la opcin 4 o envenos un mensaje a travs de Clinical cytogeneticist.   No podemos decirle cul ser su copago  por los medicamentos por adelantado ya que esto es diferente dependiendo de la cobertura de su seguro. Sin embargo, es posible que podamos encontrar un medicamento sustituto a Audiological scientist un formulario para que el seguro cubra el medicamento que se considera necesario.   Si se requiere una autorizacin previa para que su compaa de seguros malta su medicamento, por favor permtanos de 1 a 2 das hbiles para completar este proceso.  Los precios de los medicamentos varan con frecuencia dependiendo del Environmental consultant de dnde se surte la receta y alguna farmacias pueden ofrecer precios ms baratos.  El sitio web www.goodrx.com tiene cupones para medicamentos de Health and safety inspector. Los precios aqu no tienen en cuenta lo que podra costar con la ayuda del seguro (puede ser ms barato con su seguro), pero el sitio web puede darle el precio si no utiliz Tourist information centre manager.  - Puede imprimir el cupn correspondiente y llevarlo con  su receta a la farmacia.  - Tambin puede pasar por nuestra oficina durante el horario de atencin regular y Education officer, museum una tarjeta de cupones de GoodRx.  - Si necesita que su receta se enve electrnicamente a una farmacia diferente, informe a nuestra oficina a travs de MyChart de [place] o por telfono llamando al [phone] y presione la opcin 4.

## 2023-09-06 ENCOUNTER — Encounter: Payer: Self-pay | Admitting: Dermatology

## 2023-09-25 ENCOUNTER — Ambulatory Visit: Admitting: Dermatology

## 2023-10-15 ENCOUNTER — Other Ambulatory Visit: Payer: Self-pay | Admitting: Cardiovascular Disease

## 2023-11-05 DIAGNOSIS — C4492 Squamous cell carcinoma of skin, unspecified: Secondary | ICD-10-CM

## 2023-11-05 HISTORY — DX: Squamous cell carcinoma of skin, unspecified: C44.92

## 2023-11-06 ENCOUNTER — Ambulatory Visit (INDEPENDENT_AMBULATORY_CARE_PROVIDER_SITE_OTHER): Admitting: Dermatology

## 2023-11-06 DIAGNOSIS — L820 Inflamed seborrheic keratosis: Secondary | ICD-10-CM | POA: Diagnosis not present

## 2023-11-06 DIAGNOSIS — L905 Scar conditions and fibrosis of skin: Secondary | ICD-10-CM | POA: Diagnosis not present

## 2023-11-06 DIAGNOSIS — L570 Actinic keratosis: Secondary | ICD-10-CM

## 2023-11-06 DIAGNOSIS — L578 Other skin changes due to chronic exposure to nonionizing radiation: Secondary | ICD-10-CM | POA: Diagnosis not present

## 2023-11-06 DIAGNOSIS — C4442 Squamous cell carcinoma of skin of scalp and neck: Secondary | ICD-10-CM

## 2023-11-06 DIAGNOSIS — W908XXA Exposure to other nonionizing radiation, initial encounter: Secondary | ICD-10-CM | POA: Diagnosis not present

## 2023-11-06 DIAGNOSIS — Z86007 Personal history of in-situ neoplasm of skin: Secondary | ICD-10-CM | POA: Diagnosis not present

## 2023-11-06 DIAGNOSIS — D492 Neoplasm of unspecified behavior of bone, soft tissue, and skin: Secondary | ICD-10-CM

## 2023-11-06 DIAGNOSIS — Z8589 Personal history of malignant neoplasm of other organs and systems: Secondary | ICD-10-CM

## 2023-11-06 NOTE — Patient Instructions (Addendum)

## 2023-11-06 NOTE — Progress Notes (Unsigned)
 Follow-Up Visit   Subjective  Johnny [name] is a 83 y.o. male who presents for the following: AK 74m f/u, face, ears, check spot R temple, R shoulder, back, recheck SCC bx proven R neck, LN2 06/25/23, 5FU/Calcipotriene x 3 rounds  Patient accompanied by daughter who contributes to history.  The patient has spots, moles and lesions to be evaluated, some may be new or changing and the patient may have concern these could be cancer.  The following portions of the chart were reviewed this encounter and updated as appropriate: medications, allergies, medical history  Review of Systems:  No other skin or systemic complaints except as noted in HPI or Assessment and Plan.  Objective  Well appearing patient in no apparent distress; mood and affect are within normal limits.    A focused examination was performed of the following areas: Scalp, face, neck  Relevant exam findings are noted in the Assessment and Plan.  scalp x 2, L ear x 1 (3) Pink scaly macules R temple at brow x 1, R post shoulder x 1, R scapula x 1 (3) Stuck on waxy paps with erythema R neck inferior of crust Crusted pink plaque  R neck middle anterior to crust Crusted pink plaque   Assessment & Plan   ACTINIC DAMAGE - chronic, secondary to cumulative UV radiation exposure/sun exposure over time - diffuse scaly erythematous macules with underlying dyspigmentation - Recommend daily broad spectrum sunscreen SPF 30+ to sun-exposed areas, reapply every 2 hours as needed.  - Recommend staying in the shade or wearing long sleeves, sun glasses (UVA+UVB protection) and wide brim hats (4-inch brim around the entire circumference of the hat). - Call for new or changing lesions.    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  Still crust remaining. - Much improved, but a crust remaining - Plan biopsy x 2 today. - No lymphadenopathy -  Recommend regular full body skin exams - Recommend daily broad spectrum sunscreen SPF 30+ to sun-exposed areas, reapply every 2 hours as needed.  - Call if any new or changing lesions are noted between office visits    AK (ACTINIC KERATOSIS) (3) scalp x 2, L ear x 1 (3) Actinic keratoses are precancerous spots that appear secondary to cumulative UV radiation exposure/sun exposure over time. They are chronic with expected duration over 1 year. A portion of actinic keratoses will progress to squamous cell carcinoma of the skin. It is not possible to reliably predict which spots will progress to skin cancer and so treatment is recommended to prevent development of skin cancer.  Recommend daily broad spectrum sunscreen SPF 30+ to sun-exposed areas, reapply every 2 hours as needed.  Recommend staying in the shade or wearing long sleeves, sun glasses (UVA+UVB protection) and wide brim hats (4-inch brim around the entire circumference of the hat). Call for new or changing lesions. Destruction of lesion - scalp x 2, L ear x 1 (3) Complexity: simple   Destruction method: cryotherapy   Informed consent: discussed and consent obtained   Timeout:  patient name, date of birth, surgical site, and procedure verified Lesion destroyed using liquid nitrogen: Yes   Region frozen until ice ball extended beyond lesion: Yes   Outcome: patient tolerated procedure well with no complications   Post-procedure details: wound care instructions given    INFLAMED SEBORRHEIC KERATOSIS (3) R temple at brow x 1, R post shoulder  x 1, R scapula x 1 (3) Symptomatic, irritating, patient would like treated. Destruction of lesion - R temple at brow x 1, R post shoulder x 1, R scapula x 1 (3) Complexity: simple   Destruction method: cryotherapy   Informed consent: discussed and consent obtained   Timeout:  patient name, date of birth, surgical site, and procedure verified Lesion destroyed using liquid nitrogen: Yes   Region  frozen until ice ball extended beyond lesion: Yes   Outcome: patient tolerated procedure well with no complications   Post-procedure details: wound care instructions given    NEOPLASM OF SKIN (2) R neck inferior of crust Skin / nail biopsy Type of biopsy: tangential   Informed consent: discussed and consent obtained   Timeout: patient name, date of birth, surgical site, and procedure verified   Procedure prep:  Patient was prepped and draped in usual sterile fashion Prep type:  Isopropyl alcohol Anesthesia: the lesion was anesthetized in a standard fashion   Anesthetic:  1% lidocaine  w/ epinephrine  1-100,000 buffered w/ 8.4% NaHCO3 Instrument used: flexible razor blade   Outcome: patient tolerated procedure well   Post-procedure details: sterile dressing applied and wound care instructions given   Dressing type: bandage and bacitracin    Specimen 1 - Surgical pathology Differential Diagnosis: R/O persistent SCC  Check Margins: No Crusted pink plaque Accession: [id number]  R neck middle anterior to crust Skin / nail biopsy Type of biopsy: tangential   Informed consent: discussed and consent obtained   Timeout: patient name, date of birth, surgical site, and procedure verified   Procedure prep:  Patient was prepped and draped in usual sterile fashion Prep type:  Isopropyl alcohol Anesthesia: the lesion was anesthetized in a standard fashion   Anesthetic:  1% lidocaine  w/ epinephrine  1-100,000 buffered w/ 8.4% NaHCO3 Instrument used: flexible razor blade   Outcome: patient tolerated procedure well   Post-procedure details: sterile dressing applied and wound care instructions given   Dressing type: bandage and bacitracin    Specimen 2 - Surgical pathology Differential Diagnosis: R/O persistent SCC  Check Margins: No Crusted pink plaque Accession: [id number] Bx proven SCC txted with LN2 06/25/23, 5FU/Calcipotriene x 3 rounds ACTINIC SKIN DAMAGE   Return in about 6  months (around 05/05/2024) for AK f/u.  I, Grayce Saunas, RMA, am acting as scribe for Alm Rhyme, MD .   Documentation: I have reviewed the above documentation for accuracy and completeness, and I agree with the above.  Alm Rhyme, MD

## 2023-11-07 ENCOUNTER — Encounter: Payer: Self-pay | Admitting: Dermatology

## 2023-11-07 ENCOUNTER — Ambulatory Visit: Payer: Self-pay | Admitting: Dermatology

## 2023-11-07 DIAGNOSIS — C4492 Squamous cell carcinoma of skin, unspecified: Secondary | ICD-10-CM

## 2023-11-07 LAB — SURGICAL PATHOLOGY

## 2023-11-08 ENCOUNTER — Encounter: Payer: Self-pay | Admitting: Dermatology

## 2023-11-08 NOTE — Telephone Encounter (Signed)
 Patient's daughter Jenkins, called back and advised of results and referral placed for Mohs to Dr. Corey. Daughter voiced understanding to all.

## 2023-11-08 NOTE — Telephone Encounter (Addendum)
 Tried calling patient regarding results. No answer. Lm for patient to return call. Sent referral to Mohs for Dr. Corey to treat scc at right neck inferior of crust.    ----- Message from Alm Rhyme sent at 11/07/2023  6:43 PM EDT ----- FINAL DIAGNOSIS        1. Skin, R neck inferior of crust :       MODERATELY DIFFERENTIATED SQUAMOUS CELL CARCINOMA        2. Skin, R neck middle anterior to crust :       DERMAL SCAR   1- Cancer = SCC Moderately well differentiated Recommend MOHS  Schedule with Dr Corey 2- Scar No cancer - previous site of SCCis - now clear after topical Chemo. ----- Message ----- From: Interface, Lab In Three Zero One Sent: 11/07/2023   6:06 PM EDT To: Alm JAYSON Rhyme, MD

## 2023-12-10 ENCOUNTER — Encounter: Payer: Self-pay | Admitting: Dermatology

## 2023-12-12 ENCOUNTER — Encounter: Payer: Self-pay | Admitting: Dermatology

## 2023-12-12 ENCOUNTER — Ambulatory Visit: Admitting: Dermatology

## 2023-12-12 VITALS — Temp 97.7°F

## 2023-12-12 DIAGNOSIS — C4442 Squamous cell carcinoma of skin of scalp and neck: Secondary | ICD-10-CM | POA: Diagnosis not present

## 2023-12-12 DIAGNOSIS — C4492 Squamous cell carcinoma of skin, unspecified: Secondary | ICD-10-CM

## 2023-12-12 MED ORDER — DOXYCYCLINE HYCLATE 100 MG PO TABS
100.0000 mg | ORAL_TABLET | Freq: Two times a day (BID) | ORAL | 0 refills | Status: AC
Start: 2023-12-12 — End: 2023-12-19

## 2023-12-12 MED ORDER — OXYCODONE HCL 5 MG PO TABS: 5.0000 mg | ORAL_TABLET | Freq: Four times a day (QID) | ORAL | 0 refills | Status: DC | PRN

## 2023-12-12 NOTE — Progress Notes (Signed)
 Follow-Up Visit   Subjective  Johnny [name] is a 83 y.o. 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 Hester. This area of his neck had been treated with topical 5FU/calcipotriene combination for 14 days previously and also had been cryoed. He is accompanied by his daughter.  The following portions of the chart were reviewed this encounter and updated as appropriate: medications, allergies, medical history  Review of Systems:  No other skin or systemic complaints except as noted in HPI or Assessment and Plan.  Objective  Well appearing patient in no apparent distress; mood and affect are within normal limits.  A focused examination was performed of the following areas: Right neck inferior of crust Relevant physical exam findings are noted in the Assessment and Plan.   right neck inferior of crust Large fungating hemorrhagic mass with surrounding atrophic scar like skin   Assessment & Plan   SQUAMOUS CELL CARCINOMA OF SKIN right neck inferior of crust Mohs surgery  Consent obtained: written  Anticoagulation: Is the patient taking prescription anticoagulant and/or aspirin  prescribed/recommended by a physician? Yes   Was the anticoagulation regimen changed prior to Mohs? No    Anesthesia: Anesthesia method: local infiltration Local anesthetic: lidocaine  1% WITH epi  Procedure Details: Timeout: pre-procedure verification complete Procedure Prep: patient was prepped and draped in usual sterile fashion Prep type: chlorhexidine Biopsy accession number: [id number] Frozen section biopsy performed: No   Specimen debulked: No   Pre-Op  diagnosis: squamous cell carcinoma SCC subtype: moderately differentiated MohsAIQ Surgical site (if tumor spans multiple areas, please select predominant area): neck Surgery side: right Surgical site (from skin exam): right neck inferior  of crust Pre-operative length (cm): 2.2 Pre-operative width (cm): 2 Indications for Mohs surgery: anatomic location where tissue conservation is critical, ill-defined borders and aggressive histology Previously treated? No    Micrographic Surgery Details: Post-operative length (cm): 8 Post-operative width (cm): 8.5 Number of Mohs stages: 5 Post surgery depth of defect: dermis, subcutaneous fat and skeletal muscle Is this a complex case (associate members only): No    Stage 1    Tumor features identified on Mohs section: squamous cell carcinoma    Depth of tumor invasion after stage: dermis  Stage 2    Tumor features identified on Mohs section: squamous cell carcinoma    Depth of tumor invasion after stage: dermis  Stage 3    Tumor features identified on Mohs section: squamous cell carcinoma    Depth of tumor invasion after stage: dermis and subcutaneous fat  Stage 4    Tumor features identified on Mohs section: squamous cell carcinoma    Depth of tumor invasion after stage: dermis, subcutaneous fat and skeletal muscle  Stage 5    Tumor features identified on Mohs section: no tumor identified  Patient tolerance of procedure: tolerated well, no immediate complications  Reconstruction: Was the defect reconstructed? Yes   Was reconstruction performed by the same Mohs surgeon? Yes   Setting of reconstruction: outpatient office When was reconstruction performed? same day Type of reconstruction: linear and flap Linear reconstruction: complex Type of flap: transposition   Transposition flap type: rhombic  Antibiotics: Does patient meet AHA guidelines for endocarditis?: No   Does patient meet AHA guidelines for orthopedic prophylaxis?: No   Were antibiotics given on the day of surgery?: No   Did surgery breach mucosa, expose cartilage/bone, involve an area of lymphedema/inflamed/infected tissue? No  Skin repair Complexity:  Complex Final length (cm):  3.5 Informed  consent: discussed and consent obtained   Timeout: patient name, date of birth, surgical site, and procedure verified   Procedure prep:  Patient was prepped and draped in usual sterile fashion Prep type:  Chlorhexidine Anesthesia: the lesion was anesthetized in a standard fashion   Anesthetic:  1% lidocaine  w/ epinephrine  1-100,000 buffered w/ 8.4% NaHCO3 Reason for type of repair: reduce tension to allow closure, allow closure of the large defect, preserve normal anatomy and avoid adjacent structures   Undermining: area extensively undermined   Subcutaneous layers (deep stitches):  Suture size:  4-0 Suture type: Vicryl (polyglactin 910) and Monocryl (poliglecaprone 25)   Stitches:  Buried vertical mattress Fine/surface layer approximation (top stitches):  Suture size:  5-0 Suture type: Prolene (polypropylene)   Stitches: simple running   Hemostasis achieved with: suture, pressure and electrodesiccation Outcome: patient tolerated procedure well with no complications   Post-procedure details: sterile dressing applied and wound care instructions given   Dressing type: pressure dressing and bandage    Related Medications oxyCODONE  (OXY IR/ROXICODONE ) 5 MG immediate release tablet Take 1 tablet (5 mg total) by mouth every 6 (six) hours as needed for up to 15 doses.   Return in about 10 days (around 12/22/2023) for suture removal.  I, Berwyn Lesches, Surg Tech III, am acting as scribe for RUFUS CHRISTELLA HOLY, MD.    12/12/2023  HISTORY OF PRESENT ILLNESS  Johnny [name] is seen in consultation at the request of Dr. Hester for biopsy-proven Moderately Differentiated Squamous Cell Carcinoma of the right neck. They note that the area has been present for about  1 year increasing in size with time.  Reports no other new or changing lesions and has no other complaints today. Lesions previously treated with topical 5FU and cryotherapy.   Medications and allergies: see patient chart.  Review of  systems: Reviewed 8 systems and notable for the above skin cancer.  All other systems reviewed are unremarkable/negative, unless noted in the HPI. Past medical history, surgical history, family history, social history were also reviewed and are noted in the chart/questionnaire.    PHYSICAL EXAMINATION  General: Well-appearing, in no acute distress, alert and oriented x 4. Vitals reviewed in chart (if available).   Skin: Exam reveals a 8.0 x 8.5 cm erythematous papule and biopsy scar on the right neck. There are rhytids, telangiectasias, and lentigines, consistent with photodamage.  Biopsy report(s) reviewed, confirming the diagnosis.   ASSESSMENT  1) Moderately Differentiated Squamous Cell Carcinoma of the right neck 2) photodamage 3) solar lentigines   PLAN   1. Due to location, size, histology, or recurrence and the likelihood of subclinical extension as well as the need to conserve normal surrounding tissue, the patient was deemed acceptable for Mohs micrographic surgery (MMS).  The nature and purpose of the procedure, associated benefits and risks including recurrence and scarring, possible complications such as pain, infection, and bleeding, and alternative methods of treatment if appropriate were discussed with the patient during consent. The lesion location was verified by the patient, by reviewing previous notes, pathology reports, and by photographs as well as angulation measurements if available.  Informed consent was reviewed and signed by the patient, and timeout was performed at 8:30 AM. See op note below.  2. For the photodamage and solar lentigines, sun protection discussed/information given on OTC sunscreens, and we recommend continued regular follow-up with primary dermatologist every 6 months or sooner for any growing,  bleeding, or changing lesions. 3. Prognosis and future surveillance discussed. 4. Letter with treatment outcome sent to referring provider. 5. Pain  acetaminophen /ibuprofen/oxycodone  5 mg  MOHS MICROGRAPHIC SURGERY AND RECONSTRUCTION  Initial size:   2.2 x 2.0 cm Surgical defect/wound size: 8.0 x 8.5 cm Anesthesia:    0.33% lidocaine  with 1:200,000 epinephrine  EBL:    <5 mL Complications:  None Repair type:   Adjacent Tissue Transfer (Rhombic Flap) and Complex Linear SQ suture:   4.0 vicryl, 4-0 monocryl Cutaneous suture:  5-0 polyprolene Final size of the repair: 3.5 cm- linear; 9.0 x 8.5 = 76.5 cm^2  Stages: 5  STAGE I: Anesthesia achieved with 0.5% lidocaine  with 1:200,000 epinephrine . ChloraPrep applied. 2 section(s) excised using Mohs technique (this includes total peripheral and deep tissue margin excision and evaluation with frozen sections, excised and interpreted by the same physician). The tumor was first debulked and then excised with an approx. 2 mm margin.  Hemostasis was achieved with electrocautery as needed.  The specimen was then oriented, subdivided/relaxed, inked, and processed using Mohs technique.    Frozen section analysis revealed a positive margin for dysplastic stratified squamous epithelium that extends through the basement membrane and into the underlying fibrous connective tissue without attachment to the surface with malignant epithelial cells showing eosinophilic cytoplasm, hyperchromatic nuclei, pleomorphism, mitotic activity, individual cell keratinization and intercellular bridging in the deep and peripheral margin.    STAGE II: An additional 2 mm margin was excised.  Hemostasis was achieved with electrocautery as needed.  The specimen was then oriented, subdivided/relaxed, inked, and processed using Mohs technique.   Frozen section analysis revealed a positive margin for atypical epithelial cells with squamous differentiation in the dermis in the deep/peripheral margin.  STAGE III: An additional 2 mm margin was excised.  Hemostasis was achieved with electrocautery as needed.  The specimen was then  oriented, subdivided/relaxed, inked, and processed using Mohs technique.   Frozen section analysis revealed a positive margin for atypical epithelial cells with squamous differentiation in the dermis in the peripheral margin.  STAGE IV: An additional 2 mm margin was excised.  Hemostasis was achieved with electrocautery as needed.  The specimen was then oriented, subdivided/relaxed, inked, and processed using Mohs technique.   Frozen section analysis revealed a positive margin for full thickness epidermal architectural and cellular atypia, apoptotic cells, individual cell dyskeratosis with markedly altered maturation but usually still some surface keratinization and intercellular bridges present with marked nuclear atypia, including nuclear hyperchromasia and multinucleation in the peripheral margin.  STAGE V: An additional 2 mm margin was excised.  Hemostasis was achieved with electrocautery as needed.  The specimen was then oriented, subdivided/relaxed, inked, and processed using Mohs technique. Evaluation of slides by the Mohs surgeon revealed clear tumor margins.  Reconstruction  Due to large defect, a combination of a adjacent tissue transfer and complex closure  PROCEDURE: Rhombic Transposition Flap The nature of the procedure was discussed with the patient in detail, including alternatives.  The risks discussed included but not limited to potential for infection, bleeding, scar formation, and damage to underlying structures.  The patient understood the risks and signed the consent form (scanned into chart).  This wound was reconstructed with a rhombic transposition flap.  Local anesthesia was achieved with the anesthetic indicated above.  The operative site was prepped with a surgical antiseptic solution, and then draped with sterile towels to insure a sterile field.  The beveled wound edges were then excised to 90 degrees relative to the surrounding skin  plane.  The flap was cut and elevated,  and the surrounding skin was undermined in all directions.  Meticulous hemostasis was obtained with the electrosurgical device. The secondary defect was first closed.  The flap was then transposed into the defect and cut to precisely fit the wound.  A standing cone was excised to remove redundant tissue.  The wound was sutured in a layered fashion to close potential dead space and to precisely and securely approximate the wound edges.  The dimensions of the flap were:  9.0 cm x 8.5 cm for a total flap surface area of 76.5 centimeters squared (cm2).    A sterile non-stick pressure dressing was applied, the wound care instruction handout was reviewed with the patient, and appropriate follow-up care was scheduled.  The patient understands the need to return immediately for any signs of infection to include swelling, pain, purulent discharge, localized warmth, or fever.  Contact information was provided to the patient (including after-hours pager numbers).  Complex Linear Closure  The surgical wound was then cleaned, prepped, and re-anesthetized as above. Wound edges were undermined extensively along at least one entire edge and at a distance equal to or greater than the width of the defect (see wound defect size above) in order to achieve closure and decrease wound tension and anatomic distortion. Redundant tissue repair including standing cone removal was performed. Hemostasis was achieved with electrocautery. Subcutaneous and epidermal tissues were approximated with the above sutures. The surgical site was then lightly scrubbed with sterile, saline-soaked gauze.  The area was then bandaged using Vaseline ointment, non-adherent gauze, gauze pads, and tape to provide an adequate pressure dressing. The patient tolerated the procedure well, was given detailed written and verbal wound care instructions, and was discharged in good condition.   The patient will follow-up: 10 days.     Documentation: I have  reviewed the above documentation for accuracy and completeness, and I agree with the above.  RUFUS CHRISTELLA HOLY, MD

## 2023-12-12 NOTE — Patient Instructions (Signed)

## 2023-12-20 ENCOUNTER — Encounter: Payer: Self-pay | Admitting: Dermatology

## 2023-12-24 ENCOUNTER — Encounter: Payer: Self-pay | Admitting: Dermatology

## 2023-12-24 ENCOUNTER — Ambulatory Visit: Admitting: Dermatology

## 2023-12-24 VITALS — BP 117/68 | HR 97

## 2023-12-24 DIAGNOSIS — T1490XD Injury, unspecified, subsequent encounter: Secondary | ICD-10-CM

## 2023-12-24 DIAGNOSIS — C4442 Squamous cell carcinoma of skin of scalp and neck: Secondary | ICD-10-CM

## 2023-12-24 DIAGNOSIS — C4492 Squamous cell carcinoma of skin, unspecified: Secondary | ICD-10-CM

## 2023-12-24 MED ORDER — OXYCODONE HCL 5 MG PO TABS: 5.0000 mg | ORAL_TABLET | Freq: Four times a day (QID) | ORAL | 0 refills | Status: AC | PRN

## 2023-12-24 NOTE — Progress Notes (Signed)
   Follow Up Visit   Subjective  Johnny [name] is a 83 y.o. male who presents for the following: follow up from Mohs surgery   The patient presents for follow up from Mohs surgery for a SCC on the right neck inferior of crust, treated on 12/12/2023, repaired with a rhombic transposition flap. The patient has been bandaging the wound as directed. The endorse the following concerns: dull pain when turning the head to the left. Patient has taken all oxycodone  that was prescribed.   The following portions of the chart were reviewed this encounter and updated as appropriate: medications, allergies, medical history  Review of Systems:  No other skin or systemic complaints except as noted in HPI or Assessment and Plan.  Objective  Well appearing patient in no apparent distress; mood and affect are within normal limits.  A focal examination was performed including the right neck. All findings within normal limits unless otherwise noted below.  Healing wound with mild erythema  Relevant physical exam findings are noted in the Assessment and Plan.       Assessment & Plan   Healing Wound s/p Mohs for a SCC on the right neck inferior of crust, treated on 12/12/2023, repaired with a rhombic transposition flap - Suture removed today - Reassured that wound is healing well - No evidence of infection - No swelling, induration, purulence, dehiscence, or tenderness out of proportion to the clinical exam, see photo above - Discussed that scars take up to 12 months to mature from the date of surgery - Ok to continue ointment daily to wound under a bandage for another 14 - s/p 1 week of doxycycline  HISTORY OF SQUAMOUS CELL CARCINOMA OF THE SKIN - No evidence of recurrence today - No lymphadenopathy - Recommend regular full body skin exams - Recommend daily broad spectrum sunscreen SPF 30+ to sun-exposed areas, reapply every 2 hours as needed.  - Call if any new or changing lesions are noted  between office visits  SQUAMOUS CELL CARCINOMA OF SKIN   Related Medications oxyCODONE  (OXY IR/ROXICODONE ) 5 MG immediate release tablet Take 1 tablet (5 mg total) by mouth every 6 (six) hours as needed for up to 15 doses.  Return in about 3 weeks (around 01/14/2024) for Wound Check.  LILLETTE Rollene Gobble, RN, am acting as scribe for RUFUS CHRISTELLA HOLY, MD .   Documentation: I have reviewed the above documentation for accuracy and completeness, and I agree with the above.  RUFUS CHRISTELLA HOLY, MD

## 2023-12-24 NOTE — Patient Instructions (Signed)

## 2024-01-15 ENCOUNTER — Other Ambulatory Visit: Payer: Self-pay | Admitting: Cardiovascular Disease

## 2024-01-22 ENCOUNTER — Ambulatory Visit: Admitting: Dermatology

## 2024-01-29 ENCOUNTER — Encounter: Payer: Self-pay | Admitting: Dermatology

## 2024-01-29 ENCOUNTER — Ambulatory Visit: Admitting: Dermatology

## 2024-01-29 VITALS — BP 111/70 | HR 91

## 2024-01-29 NOTE — Progress Notes (Unsigned)
   Follow Up Visit   Subjective  Johnny [name] is a 83 y.o. male who presents for the following: follow up from Mohs surgery   The patient presents for follow up from Mohs surgery for a  SCC on the right neck inferior of crust, treated on 12/12/2023, repaired with a rhombic transposition flap . The patient has been bandaging the wound as directed. The endorse the following concerns: none  Accompanied by wife  The following portions of the chart were reviewed this encounter and updated as appropriate: medications, allergies, medical history  Review of Systems:  No other skin or systemic complaints except as noted in HPI or Assessment and Plan.  Objective  Well appearing patient in no apparent distress; mood and affect are within normal limits.  A focal examination was performed including face, neck  All findings within normal limits unless otherwise noted below.  Healing wound with mild erythema  Relevant physical exam findings are noted in the Assessment and Plan.     Assessment & Plan    Healing Wound s/p Mohs for  SCC on the right neck inferior of crust, treated on 12/12/2023, repaired with a rhombic transposition flap  - Reassured that wound is healing well - No evidence of infection - No swelling, induration, purulence, dehiscence, or tenderness out of proportion to the clinical exam, see photo above - Discussed that scars take up to 12 months to mature from the date of surgery - Recommend SPF 30+ to scar daily to prevent purple color from UV exposure during scar maturation process - Discussed that erythema and raised appearance of scar will fade over the next 4-6 months - OK to start scar massage at 4-6 weeks post-op - Can consider silicone based products for scar healing starting at 6 weeks post-op      Return in about 4 months (around 05/29/2024) for wound check.  I, Doyce Pan, CMA, am acting as scribe for RUFUS CHRISTELLA HOLY, MD.   Documentation: I have reviewed  the above documentation for accuracy and completeness, and I agree with the above.  RUFUS CHRISTELLA HOLY, MD

## 2024-01-29 NOTE — Patient Instructions (Signed)

## 2024-02-27 ENCOUNTER — Other Ambulatory Visit: Payer: Self-pay | Admitting: Cardiovascular Disease

## 2024-03-12 ENCOUNTER — Other Ambulatory Visit: Payer: Self-pay | Admitting: Cardiovascular Disease

## 2024-03-26 ENCOUNTER — Other Ambulatory Visit: Payer: Self-pay | Admitting: Cardiovascular Disease

## 2024-03-27 NOTE — Telephone Encounter (Signed)
 Please contact pt for future appointment. Pt due for follow up.

## 2024-04-29 ENCOUNTER — Ambulatory Visit: Admitting: Dermatology

## 2024-05-29 ENCOUNTER — Ambulatory Visit: Admitting: Dermatology
